# Patient Record
Sex: Female | Born: 1970 | Race: White | Hispanic: No | Marital: Married | Smoking: Never smoker
Health system: Southern US, Community
[De-identification: ages and names within clinical notes are randomized; demographics above are authoritative.]

## PROBLEM LIST (undated history)

## (undated) DIAGNOSIS — K219 Gastro-esophageal reflux disease without esophagitis: Secondary | ICD-10-CM

## (undated) DIAGNOSIS — K279 Peptic ulcer, site unspecified, unspecified as acute or chronic, without hemorrhage or perforation: Secondary | ICD-10-CM

## (undated) HISTORY — DX: Gastro-esophageal reflux disease without esophagitis: K21.9

## (undated) HISTORY — DX: Peptic ulcer, site unspecified, unspecified as acute or chronic, without hemorrhage or perforation: K27.9

## (undated) HISTORY — PX: ABDOMINAL HYSTERECTOMY: SHX81

## (undated) HISTORY — PX: APPENDECTOMY: SHX54

---

## 2017-05-09 ENCOUNTER — Ambulatory Visit: Payer: Self-pay | Admitting: Family Medicine

## 2019-05-29 ENCOUNTER — Other Ambulatory Visit: Payer: Self-pay | Admitting: Obstetrics and Gynecology

## 2019-05-29 DIAGNOSIS — R928 Other abnormal and inconclusive findings on diagnostic imaging of breast: Secondary | ICD-10-CM

## 2019-05-31 ENCOUNTER — Ambulatory Visit
Admission: RE | Admit: 2019-05-31 | Discharge: 2019-05-31 | Disposition: A | Discharge status: Home / Self Care | Payer: BC Managed Care – PPO | Source: Ambulatory Visit | Attending: Obstetrics and Gynecology | Admitting: Obstetrics and Gynecology

## 2019-05-31 ENCOUNTER — Other Ambulatory Visit: Payer: Self-pay

## 2019-05-31 DIAGNOSIS — R928 Other abnormal and inconclusive findings on diagnostic imaging of breast: Secondary | ICD-10-CM

## 2020-07-08 ENCOUNTER — Other Ambulatory Visit: Payer: Self-pay | Admitting: Obstetrics and Gynecology

## 2020-07-08 DIAGNOSIS — R928 Other abnormal and inconclusive findings on diagnostic imaging of breast: Secondary | ICD-10-CM

## 2020-07-29 ENCOUNTER — Ambulatory Visit
Admission: RE | Admit: 2020-07-29 | Discharge: 2020-07-29 | Disposition: A | Discharge status: Home / Self Care | Payer: BC Managed Care – PPO | Source: Ambulatory Visit | Attending: Obstetrics and Gynecology | Admitting: Obstetrics and Gynecology

## 2020-07-29 ENCOUNTER — Other Ambulatory Visit: Payer: Self-pay

## 2020-07-29 DIAGNOSIS — R928 Other abnormal and inconclusive findings on diagnostic imaging of breast: Secondary | ICD-10-CM

## 2021-04-29 ENCOUNTER — Telehealth: Payer: Self-pay

## 2021-04-29 NOTE — Telephone Encounter (Signed)
NOTES SCANNED TO REFERRAL 

## 2021-05-27 ENCOUNTER — Other Ambulatory Visit: Payer: Self-pay

## 2021-05-27 ENCOUNTER — Ambulatory Visit (INDEPENDENT_AMBULATORY_CARE_PROVIDER_SITE_OTHER): Payer: 59

## 2021-05-27 ENCOUNTER — Encounter: Payer: Self-pay | Admitting: Cardiology

## 2021-05-27 ENCOUNTER — Ambulatory Visit: Payer: 59 | Admitting: Cardiology

## 2021-05-27 VITALS — BP 92/60 | HR 60 | Ht 61.0 in | Wt 126.0 lb

## 2021-05-27 DIAGNOSIS — E785 Hyperlipidemia, unspecified: Secondary | ICD-10-CM | POA: Diagnosis not present

## 2021-05-27 DIAGNOSIS — R002 Palpitations: Secondary | ICD-10-CM | POA: Diagnosis not present

## 2021-05-27 DIAGNOSIS — Z8249 Family history of ischemic heart disease and other diseases of the circulatory system: Secondary | ICD-10-CM | POA: Diagnosis not present

## 2021-05-27 DIAGNOSIS — E7800 Pure hypercholesterolemia, unspecified: Secondary | ICD-10-CM | POA: Diagnosis not present

## 2021-05-27 NOTE — Progress Notes (Unsigned)
Enrolled for [employer] to mail a ZIO XT long term holter monitor to the patients address on file.  

## 2021-05-27 NOTE — Assessment & Plan Note (Signed)
Her father's father at age 49 had myocardial infarction cardiac arrest.  Her father at age 72 recently had a heart catheterization with calcified plaque.  She is currently asymptomatic from an anginal standpoint.  She may feel some subtle sharp pains for many years that she attributes to gas.  No exertional chest discomfort when she exercises 7 days a week, runs.  We will go ahead and check a coronary calcium score, $99.  If calcified plaque is present we will likely begin Crestor.  She reports excellent exercise and diet.

## 2021-05-27 NOTE — Assessment & Plan Note (Addendum)
Occasional palpitations felt at rest not with exercise.  No higher symptoms such as syncope.  Certainly could be PVCs or PACs.  Also a possibility is atrial tachycardia or possible SVT.  1 episode occurred that was abrupt onset and termination.  Lasted about 1 minute.  No other associated symptoms.  No murmurs heard on exam.  We will go ahead and check a Zio patch monitor for 14 days to ensure that there are no adverse arrhythmias.  TSH was normal from outside labs.  Electrolytes were also normal.

## 2021-05-27 NOTE — Patient Instructions (Signed)
Medication Instructions:  The current medical regimen is effective;  continue present plan and medications.  *If you need a refill on your cardiac medications before your next appointment, please call your pharmacy*  Testing/Procedures: Your physician has requested that you have a Coronary Calcium score which is completed by CT. Cardiac computed tomography (CT) is a painless test that uses an x-ray machine to take clear, detailed pictures of your heart. There are no instructions for this testing.  You may eat/drink and take your normal medications this day.  The cost of the testing is $99 due at the time of your appointment.  ZIO XT- Long Term Monitor Instructions  Your physician has requested you wear a ZIO patch monitor for 14 days.  This is a single patch monitor. [employer] supplies one patch monitor per enrollment. Additional stickers are not available. Please do not apply patch if you will be having a Nuclear Stress Test,  Echocardiogram, Cardiac CT, MRI, or Chest Xray during the period you would be wearing the  monitor. The patch cannot be worn during these tests. You cannot remove and re-apply the  ZIO XT patch monitor.  Your ZIO patch monitor will be mailed 3 day USPS to your address on file. It may take 3-5 days  to receive your monitor after you have been enrolled.  Once you have received your monitor, please review the enclosed instructions. Your monitor  has already been registered assigning a specific monitor serial # to you.  Billing and Patient Assistance Program Information  We have supplied [employer] with any of your insurance information on file for billing purposes. [employer] offers a sliding scale Patient Assistance Program for patients that do not have  insurance, or whose insurance does not completely cover the cost of the ZIO monitor.  You must apply for the Patient Assistance Program to qualify for this discounted rate.  To apply, please call [employer] at [phone],  select option 4, select option 2, ask to apply for  Patient Assistance Program. Theodore Demark will ask your household income, and how many people  are in your household. They will quote your out-of-pocket cost based on that information.  [employer] will also be able to set up a 15-month, interest-free payment plan if needed.  Applying the monitor   Shave hair from upper left chest.  Hold abrader disc by orange tab. Rub abrader in 40 strokes over the upper left chest as  indicated in your monitor instructions.  Clean area with 4 enclosed alcohol pads. Let dry.  Apply patch as indicated in monitor instructions. Patch will be placed under collarbone on left  side of chest with arrow pointing upward.  Rub patch adhesive wings for 2 minutes. Remove white label marked "1". Remove the white  label marked "2". Rub patch adhesive wings for 2 additional minutes.  While looking in a mirror, press and release button in center of patch. A small green light will  flash 3-4 times. This will be your only indicator that the monitor has been turned on.  Do not shower for the first 24 hours. You may shower after the first 24 hours.  Press the button if you feel a symptom. You will hear a small click. Record Date, Time and  Symptom in the Patient Logbook.  When you are ready to remove the patch, follow instructions on the last 2 pages of Patient  Logbook. Stick patch monitor onto the last page of Patient Logbook.  Place Patient Logbook in the blue and white  box. Use locking tab on box and tape box closed  securely. The blue and white box has prepaid postage on it. Please place it in the mailbox as  soon as possible. Your physician should have your test results approximately 7 days after the  monitor has been mailed back to [facility].  Call [facility] Customer Care at [phone] if you have questions regarding  your ZIO XT patch monitor. Call them immediately if you see an orange light blinking on your   monitor.  If your monitor falls off in less than 4 days, contact our Monitor department at [phone].  If your monitor becomes loose or falls off after 4 days call [employer] at [phone] for  suggestions on securing your monitor   Follow-Up: At [facility], you and your health needs are our priority.  As part of our continuing mission to provide you with exceptional heart care, we have created designated Provider Care Teams.  These Care Teams include your primary Cardiologist (physician) and Advanced Practice Providers (APPs -  Physician Assistants and Nurse Practitioners) who all work together to provide you with the care you need, when you need it.  We recommend signing up for the patient portal called "MyChart".  Sign up information is provided on this After Visit Summary.  MyChart is used to connect with patients for Virtual Visits (Telemedicine).  Patients are able to view lab/test results, encounter notes, upcoming appointments, etc.  Non-urgent messages can be sent to your provider as well.   To learn more about what you can do with MyChart, go to [email or web address].    Follow up will be based on the results of the above testing.  Thank you for choosing  [facility]!!

## 2021-05-27 NOTE — Assessment & Plan Note (Signed)
LDL 139 HDL 51 triglycerides 104 creatinine 0.89.

## 2021-05-27 NOTE — Progress Notes (Signed)
Cardiology Office Note:    Date:  05/27/2021   ID:  Samantha Solomon, DOB Jul 04, 1970, MRN [id number]  PCP:  Vivien Presto, MD  Cardiologist:  Donato Schultz, MD    Referring MD: Vivien Presto, MD   No chief complaint on file.   History of Present Illness:    Samantha Solomon is a 51 y.o. female with a hx of GERD and PUD here today for the evaluation and management of palpitations at the request of Dr. Salvadore Farber.  She last saw Dr. Salvadore Farber 04/21/21 for her annual physical. She complained of intermittent palpitations and was referred to cardiology.  Today, she reports the palpitations could occur intermittently and randomly. One episode occurred during dinner when she felt her heart rate suddenly increase. She had to take deep breaths and walk around until the palpitations stopped. This episode lasted for a few minutes. She also feels the palpitations at night and describes them as a pounding sensation.   She endorses chest pain but associates the pain to gas. When she takes a deep breath and lets it out slowly, the pain dissipates. Taking the initial deep breath causes her discomfort. She does not feel the palpitations or chest pain when exercising.   She endorses mild bilateral LE and hand edema. She notices the swelling after runs and at the end of the day. The swelling improves by the next morning.   She stays active by running and biking daily. For diet, she avoids dairy and carbohydrates. Her paternal grandfather passed away in his 71s due to cardiac arrest. Her father is 51 years old and recently underwent a heart catheterization. She denies any shortness of breath, lightheadedness, headaches, syncope, orthopnea, PND, lower extremity edema or exertional symptoms.  Past Medical History:  Diagnosis Date   GERD (gastroesophageal reflux disease)    PUD (peptic ulcer disease)     Past Surgical History:  Procedure Laterality Date   ABDOMINAL HYSTERECTOMY     APPENDECTOMY      CESAREAN SECTION      Current Medications: Current Meds  Medication Sig   CALCIUM CARB-CHOLECALCIFEROL PO Take by mouth.   finasteride (PROSCAR) 5 MG tablet Take 5 mg by mouth daily.   LINZESS 290 MCG CAPS capsule Take 290 mcg by mouth daily.   pantoprazole (PROTONIX) 40 MG tablet Take 1 tablet by mouth daily.   Rhubarb (ESTROVEN COMPLETE PO) Take by mouth.   vitamin B-12 (CYANOCOBALAMIN) 500 MCG tablet Take 500 mcg by mouth daily.     Allergies:   Patient has no known allergies.   Social History   Socioeconomic History   Marital status: Married    Spouse name: Not on file   Number of children: Not on file   Years of education: Not on file   Highest education level: Not on file  Occupational History   Not on file  Tobacco Use   Smoking status: Never   Smokeless tobacco: Never  Substance and Sexual Activity   Alcohol use: Not Currently   Drug use: Never   Sexual activity: Not on file  Other Topics Concern   Not on file  Social History Narrative   Not on file   Social Determinants of Health   Financial Resource Strain: Not on file  Food Insecurity: Not on file  Transportation Needs: Not on file  Physical Activity: Not on file  Stress: Not on file  Social Connections: Not on file     Family History: The patient's family history includes  Heart attack in her paternal grandfather.  ROS:   Please see the history of present illness.  (+) Palpitations (+) Chest pain   (+) Bilateral LE and hand edema All other systems reviewed and negative.   EKGs/Labs/Other Studies Reviewed:    The following studies were reviewed today: No prior cardiovascular studies  EKG:  EKG was personally reviewed 05/27/21: Sinus rhythm, rate 60 bpm  Recent Labs: No results found for requested labs within last 8760 hours.   Recent Lipid Panel No results found for: CHOL, TRIG, HDL, CHOLHDL, VLDL, LDLCALC, LDLDIRECT  CHA2DS2-VASc Score =   [ ] .  Therefore, the patient's annual risk of  stroke is   %.        Physical Exam:    VS:  BP 92/60 (BP Location: Left Arm, Patient Position: Sitting, Cuff Size: Normal)    Pulse 60    Ht 5\' 1"  (1.549 m)    Wt 126 lb (57.2 kg)    SpO2 98%    BMI 23.81 kg/m     Wt Readings from Last 3 Encounters:  05/27/21 126 lb (57.2 kg)     GEN: Well nourished, well developed in no acute distress HEENT: Normal NECK: No JVD; No carotid bruits LYMPHATICS: No lymphadenopathy CARDIAC: RRR, no murmurs, rubs, gallops RESPIRATORY:  Clear to auscultation without rales, wheezing or rhonchi  ABDOMEN: Soft, non-tender, non-distended MUSCULOSKELETAL:  No edema; No deformity  SKIN: Warm and dry NEUROLOGIC:  Alert and oriented x 3 PSYCHIATRIC:  Normal affect   ASSESSMENT:    1. Hyperlipidemia, unspecified hyperlipidemia type   2. Palpitations   3. Family history of coronary artery disease   4. Family history of early CAD   5. Pure hypercholesterolemia    PLAN:    Palpitations Occasional palpitations felt at rest not with exercise.  No higher symptoms such as syncope.  Certainly could be PVCs or PACs.  Also a possibility is atrial tachycardia or possible SVT.  1 episode occurred that was abrupt onset and termination.  Lasted about 1 minute.  No other associated symptoms.  No murmurs heard on exam.  We will go ahead and check a Zio patch monitor for 14 days to ensure that there are no adverse arrhythmias.  TSH was normal from outside labs.  Electrolytes were also normal.  Family history of early CAD Her father's father at age 57 had myocardial infarction cardiac arrest.  Her father at age 64 recently had a heart catheterization with calcified plaque.  She is currently asymptomatic from an anginal standpoint.  She may feel some subtle sharp pains for many years that she attributes to gas.  No exertional chest discomfort when she exercises 7 days a week, runs.  We will go ahead and check a coronary calcium score, $99.  If calcified plaque is present we  will likely begin Crestor.  She reports excellent exercise and diet.  Pure hypercholesterolemia LDL 139 HDL 51 triglycerides 104 creatinine 0.89.    Marland Kitchen   Medication Adjustments/Labs and Tests Ordered: Current medicines are reviewed at length with the patient today.  Concerns regarding medicines are outlined above.  Orders Placed This Encounter  Procedures   CT CARDIAC SCORING (SELF PAY ONLY)   LONG TERM MONITOR (3-14 DAYS)   EKG 12-Lead   No orders of the defined types were placed in this encounter.  I,Mykaella Javier,acting as a scribe for [employer], MD.,have documented all relevant documentation on the behalf of Donato Schultz, MD,as directed by  Donato Schultz,  MD while in the presence of Donato Schultz, MD.  I, Donato Schultz, MD, have reviewed all documentation for this visit. The documentation on 05/27/21 for the exam, diagnosis, procedures, and orders are all accurate and complete.   Signed, Donato Schultz, MD  05/27/2021 8:50 AM    [facility]

## 2021-05-30 DIAGNOSIS — R002 Palpitations: Secondary | ICD-10-CM

## 2021-06-29 ENCOUNTER — Telehealth: Payer: Self-pay | Admitting: Cardiology

## 2021-06-29 NOTE — Telephone Encounter (Signed)
Patient returning a call to discuss Monitor results  ?

## 2021-06-29 NOTE — Telephone Encounter (Signed)
Spoke with pt and reviewed results and recommendations.  Pt appreciative for call.  ?

## 2021-07-20 ENCOUNTER — Encounter: Payer: Self-pay | Admitting: Radiology

## 2021-07-20 ENCOUNTER — Ambulatory Visit
Admission: RE | Admit: 2021-07-20 | Discharge: 2021-07-20 | Disposition: A | Discharge status: Home / Self Care | Payer: Self-pay | Source: Ambulatory Visit | Attending: Cardiology | Admitting: Cardiology

## 2021-07-20 DIAGNOSIS — E785 Hyperlipidemia, unspecified: Secondary | ICD-10-CM

## 2021-07-20 DIAGNOSIS — Z8249 Family history of ischemic heart disease and other diseases of the circulatory system: Secondary | ICD-10-CM

## 2021-07-20 DIAGNOSIS — R002 Palpitations: Secondary | ICD-10-CM

## 2021-07-22 NOTE — Progress Notes (Signed)
?Terrilee Files D.O. ?[facility] ?[address] [place] [address] ?Phone: [phone] ?Subjective:   ?I, Wilford Grist, am serving as a scribe for Dr. Antoine Primas. ? ?This visit occurred during the SARS-CoV-2 public health emergency.  Safety protocols were in place, including screening questions prior to the visit, additional usage of staff PPE, and extensive cleaning of exam room while observing appropriate contact time as indicated for disinfecting solutions.  ?I'm seeing this patient by the request  of:  [name], [name], MD ? ?CC: Low back pain ? ?[id number]  ?Samantha Solomon is a 51 y.o. female coming in with complaint of back pain. Patient states that she has had back pain since her 43s. Pain in lumbar spine directly over the spine. No injury to this area. Ice, heat, Advil. Denies any radiating symptoms.  Patient has had difficulty with this previously.  States that he has had the pain intermittently for quite some time. ? ? ?  ? ?Past Medical History:  ?Diagnosis Date  ? GERD (gastroesophageal reflux disease)   ? PUD (peptic ulcer disease)   ? ?Past Surgical History:  ?Procedure Laterality Date  ? ABDOMINAL HYSTERECTOMY    ? APPENDECTOMY    ? CESAREAN SECTION    ? ?Social History  ? ?Socioeconomic History  ? Marital status: Married  ?  Spouse name: Not on file  ? Number of children: Not on file  ? Years of education: Not on file  ? Highest education level: Not on file  ?Occupational History  ? Not on file  ?Tobacco Use  ? Smoking status: Never  ? Smokeless tobacco: Never  ?Substance and Sexual Activity  ? Alcohol use: Not Currently  ? Drug use: Never  ? Sexual activity: Not on file  ?Other Topics Concern  ? Not on file  ?Social History Narrative  ? Not on file  ? ?Social Determinants of Health  ? ?Financial Resource Strain: Not on file  ?Food Insecurity: Not on file  ?Transportation Needs: Not on file  ?Physical Activity: Not on file  ?Stress: Not on file  ?Social Connections: Not on  file  ? ?No Known Allergies ?Family History  ?Problem Relation Age of Onset  ? Heart attack Paternal Grandfather   ?     In his 47s  ? ? ? ? ? ? ?Current Outpatient Medications (Hematological):  ?  vitamin B-12 (CYANOCOBALAMIN) 500 MCG tablet, Take 500 mcg by mouth daily. ? ?Current Outpatient Medications (Other):  ?  CALCIUM CARB-CHOLECALCIFEROL PO, Take by mouth. ?  finasteride (PROSCAR) 5 MG tablet, Take 5 mg by mouth daily. ?  LINZESS 290 MCG CAPS capsule, Take 290 mcg by mouth daily. ?  pantoprazole (PROTONIX) 40 MG tablet, Take 1 tablet by mouth daily. ?  Rhubarb (ESTROVEN COMPLETE PO), Take by mouth. ? ? ?Reviewed prior external information including notes and imaging from  ?primary care provider ?As well as notes that were available from care everywhere and other healthcare systems. ? ?Past medical history, social, surgical and family history all reviewed in electronic medical record.  No pertanent information unless stated regarding to the chief complaint.  ? ?Review of Systems: ? No headache, visual changes, nausea, vomiting, diarrhea, constipation, dizziness, abdominal pain, skin rash, fevers, chills, night sweats, weight loss, swollen lymph nodes, body aches, joint swelling, chest pain, shortness of breath, mood changes. POSITIVE muscle aches ? ?Objective  ?Blood pressure 96/66, pulse 78, height 5\' 1"  (1.549 m), weight 123 lb (55.8 kg), SpO2 98 %. ?  ?  General: No apparent distress alert and oriented x3 mood and affect normal, dressed appropriately.  ?HEENT: Pupils equal, extraocular movements intact  ?Respiratory: Patient's speak in full sentences and does not appear short of breath  ?Cardiovascular: No lower extremity edema, non tender, no erythema  ?Gait normal with good balance and coordination.  ?MSK: Low back exam does have some mild loss of lordosis.  Patient does have some mild scoliosis noted as well.  Mild tenderness to palpation in the paraspinal musculature. ? ?Osteopathic findings ?T3  extended rotated and side bent right inhaled third rib ?T9 extended rotated and side bent left ?L2 through L5 neutral, rotated right side bent left ?Sacrum right on right ? ?97110; 15 additional minutes spent for Therapeutic exercises as stated in above notes.  This included exercises focusing on stretching, strengthening, with significant focus on eccentric aspects.   Long term goals include an improvement in range of motion, strength, endurance as well as avoiding reinjury. Patient's frequency would include in 1-2 times a day, 3-5 times a week for a duration of 6-12 weeks. Low back exercises that included:  ?Pelvic tilt/bracing instruction to focus on control of the pelvic girdle and lower abdominal muscles  ?Glute strengthening exercises, focusing on proper firing of the glutes without engaging the low back muscles ?Proper stretching techniques for maximum relief for the hamstrings, hip flexors, low back and some rotation where tolerated ? Proper technique shown and discussed handout in great detail with ATC.  All questions were discussed and answered.  ? ?  ?Impression and Recommendations:  ?  ? ?The above documentation has been reviewed and is accurate and complete Judi Saa, DO ? ? ? ?

## 2021-07-23 ENCOUNTER — Telehealth: Payer: Self-pay | Admitting: Cardiology

## 2021-07-23 ENCOUNTER — Ambulatory Visit: Payer: 59 | Admitting: Family Medicine

## 2021-07-23 VITALS — BP 96/66 | HR 78 | Ht 61.0 in | Wt 123.0 lb

## 2021-07-23 DIAGNOSIS — M9902 Segmental and somatic dysfunction of thoracic region: Secondary | ICD-10-CM | POA: Diagnosis not present

## 2021-07-23 DIAGNOSIS — M9904 Segmental and somatic dysfunction of sacral region: Secondary | ICD-10-CM

## 2021-07-23 DIAGNOSIS — M9903 Segmental and somatic dysfunction of lumbar region: Secondary | ICD-10-CM | POA: Diagnosis not present

## 2021-07-23 DIAGNOSIS — M545 Low back pain, unspecified: Secondary | ICD-10-CM | POA: Diagnosis not present

## 2021-07-23 NOTE — Telephone Encounter (Signed)
Patient calling for CT results. 

## 2021-07-23 NOTE — Telephone Encounter (Signed)
Spoke with pt and reviewed results of Calcium Score of 0.  Pt was pleased with results.  All questions, if any were answered at the time of the call.   ?

## 2021-07-23 NOTE — Assessment & Plan Note (Signed)
Patient does have some mild underlying scoliosis that attempted some difficulty.  Discussed with patient about icing regimen and home exercises.  Discussed some core strengthening exercises.  Nothing severe that should stop her from significant activity.  Responded extremely well to osteopathic manipulation.  Hold on any type of medications at the moment.  Follow-up again 6 to 12 weeks ?

## 2021-07-23 NOTE — Assessment & Plan Note (Signed)

## 2021-07-23 NOTE — Patient Instructions (Signed)
Exercises ?IBU 300mg  3x a day with flares ?Ice is better than heat ?See me in 6 weeks ?
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
## 2021-09-03 ENCOUNTER — Ambulatory Visit (INDEPENDENT_AMBULATORY_CARE_PROVIDER_SITE_OTHER): Payer: 59

## 2021-09-03 ENCOUNTER — Ambulatory Visit: Payer: 59 | Admitting: Family Medicine

## 2021-09-03 VITALS — BP 112/70 | HR 81 | Ht 61.0 in | Wt 125.0 lb

## 2021-09-03 DIAGNOSIS — M9903 Segmental and somatic dysfunction of lumbar region: Secondary | ICD-10-CM | POA: Diagnosis not present

## 2021-09-03 DIAGNOSIS — M9901 Segmental and somatic dysfunction of cervical region: Secondary | ICD-10-CM | POA: Diagnosis not present

## 2021-09-03 DIAGNOSIS — M9904 Segmental and somatic dysfunction of sacral region: Secondary | ICD-10-CM | POA: Diagnosis not present

## 2021-09-03 DIAGNOSIS — M79671 Pain in right foot: Secondary | ICD-10-CM | POA: Diagnosis not present

## 2021-09-03 DIAGNOSIS — M79674 Pain in right toe(s): Secondary | ICD-10-CM

## 2021-09-03 DIAGNOSIS — M9902 Segmental and somatic dysfunction of thoracic region: Secondary | ICD-10-CM

## 2021-09-03 DIAGNOSIS — M545 Low back pain, unspecified: Secondary | ICD-10-CM

## 2021-09-03 DIAGNOSIS — M9908 Segmental and somatic dysfunction of rib cage: Secondary | ICD-10-CM | POA: Diagnosis not present

## 2021-09-03 NOTE — Assessment & Plan Note (Signed)
Has contusion and likely fifth metatarsal fracture.  Nondisplaced.  Discussed regimen made foot shoes.  Discussed avoiding being barefoot, Arnica lotion follow-up again in 4 weeks and return if any worsening pain.

## 2021-09-03 NOTE — Assessment & Plan Note (Signed)
Chronic problem with exacerbation likely secondary to palpation of been walking since a foot injury.  Responded well to osteopathic manipulation.  Discussed with patient anti-inflammatories for the exacerbation Follow-up again 6 to 8 weeks

## 2021-09-03 NOTE — Patient Instructions (Signed)
Xray today Arnica lotion for you and daughter Tell daughter to take it easy until Monday See you again in 6-8 weeks

## 2021-10-21 NOTE — Progress Notes (Signed)
[name] [address] [place] [address] Phone: [phone] Subjective:   Samantha Solomon, am serving as a scribe for Dr. Antoine Primas.   I'm seeing this patient by the request  of:  [name], [name], MD  CC: Back pain, foot pain follow-up  [id number]  Samantha Solomon is a 51 y.o. female coming in with complaint of back and neck pain. OMT 09/03/2021. Also f/u for R foot pain. Patient states that the foot is much better. Her back is also improving. She states that she is more mindful of positions that might increase pain in her back.   Medications patient has been prescribed: None  Taking:         Reviewed prior external information including notes and imaging from previsou exam, outside providers and external EMR if available.   As well as notes that were available from care everywhere and other healthcare systems.  Past medical history, social, surgical and family history all reviewed in electronic medical record.  No pertanent information unless stated regarding to the chief complaint.   Past Medical History:  Diagnosis Date   GERD (gastroesophageal reflux disease)    PUD (peptic ulcer disease)     No Known Allergies   Review of Systems:  No headache, visual changes, nausea, vomiting, diarrhea, constipation, dizziness, abdominal pain, skin rash, fevers, chills, night sweats, weight loss, swollen lymph nodes, body aches, joint swelling, chest pain, shortness of breath, mood changes. POSITIVE muscle aches  Objective  Blood pressure 102/76, pulse 67, height 5\' 1"  (1.549 m), weight 122 lb (55.3 kg), SpO2 99 %.   General: No apparent distress alert and oriented x3 mood and affect normal, dressed appropriately.  HEENT: Pupils equal, extraocular movements intact  Respiratory: Patient's speak in full sentences and does not appear short of breath  Cardiovascular: No lower extremity edema, non tender, no erythema  Gait  normal MSK: Foot exam does not have any significant pain today. Back does have some loss of lordosis.  Some tightness noted in the paraspinal musculature seems to be right greater than left.  Positive FABER test noted on the right side.  Osteopathic findings  C2 flexed rotated and side bent right C7 flexed rotated and side bent left T3 extended rotated and side bent right inhaled rib T8 extended rotated and side bent left L2 flexed rotated and side bent right Sacrum right on right       Assessment and Plan:  Right foot pain Relatively nontender on exam.  No other changes follow-up as needed  Low back pain Chronic, with mild exacerbation again.  Encourage patient to continue to work on core strengthening.  Discussed posture and ergonomics otherwise.  Increase activity slowly.  Follow-up again in 6 to 8 weeks.  Patient has responded well to the manipulation.    Nonallopathic problems  Decision today to treat with OMT was based on Physical Exam  After verbal consent patient was treated with HVLA, ME, FPR techniques in cervical, rib, thoracic, lumbar, and sacral  areas  Patient tolerated the procedure well with improvement in symptoms  Patient given exercises, stretches and lifestyle modifications  See medications in patient instructions if given  Patient will follow up in 4-8 weeks     The above documentation has been reviewed and is accurate and complete , DO         Note: This dictation was prepared with Dragon dictation along with smaller phrase technology. Any transcriptional errors that  result from this process are unintentional.

## 2021-10-26 ENCOUNTER — Encounter: Payer: Self-pay | Admitting: Family Medicine

## 2021-10-26 ENCOUNTER — Ambulatory Visit: Payer: 59 | Admitting: Family Medicine

## 2021-10-26 ENCOUNTER — Ambulatory Visit: Payer: Self-pay

## 2021-10-26 VITALS — BP 102/76 | HR 67 | Ht 61.0 in | Wt 122.0 lb

## 2021-10-26 DIAGNOSIS — M9902 Segmental and somatic dysfunction of thoracic region: Secondary | ICD-10-CM | POA: Diagnosis not present

## 2021-10-26 DIAGNOSIS — M9908 Segmental and somatic dysfunction of rib cage: Secondary | ICD-10-CM | POA: Diagnosis not present

## 2021-10-26 DIAGNOSIS — M9903 Segmental and somatic dysfunction of lumbar region: Secondary | ICD-10-CM | POA: Diagnosis not present

## 2021-10-26 DIAGNOSIS — M9901 Segmental and somatic dysfunction of cervical region: Secondary | ICD-10-CM

## 2021-10-26 DIAGNOSIS — M9904 Segmental and somatic dysfunction of sacral region: Secondary | ICD-10-CM | POA: Diagnosis not present

## 2021-10-26 DIAGNOSIS — M79671 Pain in right foot: Secondary | ICD-10-CM

## 2021-10-26 DIAGNOSIS — M545 Low back pain, unspecified: Secondary | ICD-10-CM | POA: Diagnosis not present

## 2021-10-26 NOTE — Patient Instructions (Signed)
See me in 2-3 months

## 2021-10-26 NOTE — Assessment & Plan Note (Signed)
Chronic, with mild exacerbation again.  Encourage patient to continue to work on core strengthening.  Discussed posture and ergonomics otherwise.  Increase activity slowly.  Follow-up again in 6 to 8 weeks.  Patient has responded well to the manipulation.

## 2021-10-26 NOTE — Assessment & Plan Note (Signed)
Relatively nontender on exam.  No other changes follow-up as needed

## 2021-12-20 ENCOUNTER — Telehealth: Payer: Self-pay | Admitting: Oncology

## 2021-12-21 ENCOUNTER — Other Ambulatory Visit: Payer: Self-pay | Admitting: *Deleted

## 2021-12-21 DIAGNOSIS — I82492 Acute embolism and thrombosis of other specified deep vein of left lower extremity: Secondary | ICD-10-CM

## 2021-12-22 ENCOUNTER — Telehealth: Payer: Self-pay | Admitting: Hematology and Oncology

## 2021-12-22 NOTE — Telephone Encounter (Signed)
Scheduled appt per 9/19 referral. Pt is aware of appt date and time. Pt is aware to arrive 15 mins prior to appt time and to bring and updated insurance card. Pt is aware of appt location.   

## 2022-01-05 NOTE — Progress Notes (Signed)
[facility] Phone: [phone] Subjective:   [name], am serving as a scribe for Dr. Hulan Saas.  I'm seeing this patient by the request  of:  [name], [name], MD  CC: Low back pain  [id number]  Tabbatha Bordelon is a 51 y.o. female coming in with complaint of back and neck pain. OMT 10/26/2021. Was bitten by copperhead since last visit. Patient states that she has been sedentary and had to use crutches for 4 weeks. Feels as if lumbar spine and hips are out of alignment.   Medications patient has been prescribed: None  Taking:         Reviewed prior external information including notes and imaging from previsou exam, outside providers and external EMR if available.   As well as notes that were available from care everywhere and other healthcare systems.  Past medical history, social, surgical and family history all reviewed in electronic medical record.  No pertanent information unless stated regarding to the chief complaint.   Past Medical History:  Diagnosis Date   GERD (gastroesophageal reflux disease)    PUD (peptic ulcer disease)     No Known Allergies   Review of Systems:  No headache, visual changes, nausea, vomiting, diarrhea, constipation, dizziness, abdominal pain, skin rash, fevers, chills, night sweats, weight loss, swollen lymph nodes, body aches, joint swelling, chest pain, shortness of breath, mood changes. POSITIVE muscle aches  Objective  Blood pressure 102/72, pulse 82, height 5\' 1"  (1.549 m), weight 124 lb (56.2 kg), SpO2 98 %.   General: No apparent distress alert and oriented x3 mood and affect normal, dressed appropriately.  HEENT: Pupils equal, extraocular movements intact  Respiratory: Patient's speak in full sentences and does not appear short of breath  Cardiovascular: No lower extremity edema, non tender, no erythema  Gait somewhat guarded protecting  patient's left lower extremity secondary to some mild swelling in the calf. MSK:  Back low back does have some loss of lordosis.  Some severe tenderness over the sacroiliac joint.  Positive Faber on the right side.  Osteopathic findings  C6 flexed rotated and side bent left T5 extended rotated and side bent right inhaled rib L2 flexed rotated and side bent right Sacrum right on right       Assessment and Plan:  Low back pain Patient does have low back pain noted.  Patient does have tenderness to palpation over the sacroiliac joint.  Patient does have a positive pain on the right side with positive FABER test.  History of DVT (deep vein thrombosis) History of DVT.  Patient was bitten by a snake everything at that visit.  Patient does still have some soft tissue swelling we discussed compression being potentially beneficial.  Discussed with patient a mild heel lift.  Patient will be following up with hematology in the near future to discuss the blood thinner.    Nonallopathic problems  Decision today to treat with OMT was based on Physical Exam  After verbal consent patient was treated with HVLA, ME, FPR techniques in cervical, rib, thoracic, lumbar, and sacral  areas  Patient tolerated the procedure well with improvement in symptoms  Patient given exercises, stretches and lifestyle modifications  See medications in patient instructions if given  Patient will follow up in 4-8 weeks      The above documentation has been reviewed and is accurate and complete Lyndal Pulley, DO  Note: This dictation was prepared with Dragon dictation along with smaller phrase technology. Any transcriptional errors that result from this process are unintentional.

## 2022-01-07 ENCOUNTER — Ambulatory Visit (INDEPENDENT_AMBULATORY_CARE_PROVIDER_SITE_OTHER): Payer: 59 | Admitting: Family Medicine

## 2022-01-07 ENCOUNTER — Encounter: Payer: Self-pay | Admitting: Family Medicine

## 2022-01-07 VITALS — BP 102/72 | HR 82 | Ht 61.0 in | Wt 124.0 lb

## 2022-01-07 DIAGNOSIS — Z86718 Personal history of other venous thrombosis and embolism: Secondary | ICD-10-CM | POA: Insufficient documentation

## 2022-01-07 DIAGNOSIS — M9901 Segmental and somatic dysfunction of cervical region: Secondary | ICD-10-CM | POA: Diagnosis not present

## 2022-01-07 DIAGNOSIS — M9902 Segmental and somatic dysfunction of thoracic region: Secondary | ICD-10-CM

## 2022-01-07 DIAGNOSIS — M545 Low back pain, unspecified: Secondary | ICD-10-CM | POA: Diagnosis not present

## 2022-01-07 DIAGNOSIS — M9904 Segmental and somatic dysfunction of sacral region: Secondary | ICD-10-CM

## 2022-01-07 DIAGNOSIS — M9908 Segmental and somatic dysfunction of rib cage: Secondary | ICD-10-CM

## 2022-01-07 DIAGNOSIS — M9903 Segmental and somatic dysfunction of lumbar region: Secondary | ICD-10-CM

## 2022-01-07 MED ORDER — GABAPENTIN 100 MG PO CAPS: 200.0000 mg | ORAL_CAPSULE | Freq: Every day | ORAL | 0 refills | Status: DC

## 2022-01-07 NOTE — Patient Instructions (Signed)
Good to see you Gabapentin 200mg  at night Wear compression socks 15mg  of mercury for socks 1/8 inch heel lift Ok to start increasing activity See me in 4-6 weeks

## 2022-01-07 NOTE — Assessment & Plan Note (Signed)
Patient does have low back pain noted.  Patient does have tenderness to palpation over the sacroiliac joint.  Patient does have a positive pain on the right side with positive FABER test.

## 2022-01-07 NOTE — Assessment & Plan Note (Addendum)
History of DVT.  Patient was bitten by a snake everything at that visit.  Patient does still have some soft tissue swelling we discussed compression being potentially beneficial.  Discussed with patient a mild heel lift.  Patient will be following up with hematology in the near future to discuss the blood thinner.  Gabapentin given for the neurologic aspect of the pain she is having.

## 2022-01-14 ENCOUNTER — Inpatient Hospital Stay: Payer: 59 | Attending: Hematology and Oncology | Admitting: Hematology and Oncology

## 2022-01-14 ENCOUNTER — Other Ambulatory Visit: Payer: Self-pay | Admitting: *Deleted

## 2022-01-14 ENCOUNTER — Inpatient Hospital Stay: Payer: 59

## 2022-01-14 VITALS — BP 108/62 | HR 79 | Temp 97.9°F | Resp 14 | Wt 125.3 lb

## 2022-01-14 DIAGNOSIS — Z800 Family history of malignant neoplasm of digestive organs: Secondary | ICD-10-CM | POA: Diagnosis not present

## 2022-01-14 DIAGNOSIS — I82492 Acute embolism and thrombosis of other specified deep vein of left lower extremity: Secondary | ICD-10-CM

## 2022-01-14 DIAGNOSIS — Z7901 Long term (current) use of anticoagulants: Secondary | ICD-10-CM | POA: Diagnosis not present

## 2022-01-14 DIAGNOSIS — I82402 Acute embolism and thrombosis of unspecified deep veins of left lower extremity: Secondary | ICD-10-CM | POA: Diagnosis present

## 2022-01-14 LAB — CBC WITH DIFFERENTIAL (CANCER CENTER ONLY)
Abs Immature Granulocytes: 0.01 10*3/uL (ref 0.00–0.07)
Basophils Absolute: 0 10*3/uL (ref 0.0–0.1)
Basophils Relative: 0 %
Eosinophils Absolute: 0.1 10*3/uL (ref 0.0–0.5)
Eosinophils Relative: 2 %
HCT: 35.9 % — ABNORMAL LOW (ref 36.0–46.0)
Hemoglobin: 12 g/dL (ref 12.0–15.0)
Immature Granulocytes: 0 %
Lymphocytes Relative: 21 %
Lymphs Abs: 1.3 10*3/uL (ref 0.7–4.0)
MCH: 29.2 pg (ref 26.0–34.0)
MCHC: 33.4 g/dL (ref 30.0–36.0)
MCV: 87.3 fL (ref 80.0–100.0)
Monocytes Absolute: 0.5 10*3/uL (ref 0.1–1.0)
Monocytes Relative: 9 %
Neutro Abs: 4 10*3/uL (ref 1.7–7.7)
Neutrophils Relative %: 68 %
Platelet Count: 257 10*3/uL (ref 150–400)
RBC: 4.11 MIL/uL (ref 3.87–5.11)
RDW: 12.9 % (ref 11.5–15.5)
WBC Count: 5.9 10*3/uL (ref 4.0–10.5)
nRBC: 0 % (ref 0.0–0.2)

## 2022-01-14 LAB — CMP (CANCER CENTER ONLY)
ALT: 21 U/L (ref 0–44)
AST: 13 U/L — ABNORMAL LOW (ref 15–41)
Albumin: 4.4 g/dL (ref 3.5–5.0)
Alkaline Phosphatase: 60 U/L (ref 38–126)
Anion gap: 4 — ABNORMAL LOW (ref 5–15)
BUN: 11 mg/dL (ref 6–20)
CO2: 30 mmol/L (ref 22–32)
Calcium: 9.1 mg/dL (ref 8.9–10.3)
Chloride: 104 mmol/L (ref 98–111)
Creatinine: 0.7 mg/dL (ref 0.44–1.00)
GFR, Estimated: >60 mL/min (ref >60)
Glucose, Bld: 93 mg/dL (ref 70–99)
Potassium: 3.6 mmol/L (ref 3.5–5.1)
Sodium: 138 mmol/L (ref 135–145)
Total Bilirubin: 0.4 mg/dL (ref 0.3–1.2)
Total Protein: 7.1 g/dL (ref 6.5–8.1)

## 2022-01-14 MED ORDER — APIXABAN 5 MG PO TABS: 5.0000 mg | ORAL_TABLET | Freq: Two times a day (BID) | ORAL | 3 refills | Status: DC

## 2022-01-14 NOTE — Progress Notes (Signed)
[facility] [phone]   [phone]  INITIAL CONSULT NOTE  Samantha Solomon Care Team: [name], Meredith Mody, MD as PCP - General (Family Medicine) Jake Bathe, MD as PCP - Cardiology (Cardiology)  Hematological/Oncological History # Left Lower Extremity DVT Provoked by Snake Bite 12/01/2021: emergency department visit for snake bite and LLE pain. Received antivenom.  12/09/2021: returned to ED with worsening leg pain. DVT ultrasound obtained and did show occlusive thrombi throughout the lower leg, but no DVTs above the knee. Started on elqiuis.  01/14/2022: establish care with Dr. Leonides Schanz   CHIEF COMPLAINTS/PURPOSE OF CONSULTATION:  "Left Lower Extremity DVT  "  HISTORY OF PRESENTING ILLNESS:  Samantha Solomon 51 y.o. female with medical history significant for GERD, PUD, MTHFR mutation, and OCP use who presents for evaluation of DVT provoked by a snake bite.   On review of the previous records Samantha Solomon presents to the emergency department on 12/01/2021 after a snakebite.  She simply return to the emergency department on 12/09/2021 with worsening leg pain.  DVT ultrasound was performed and showed evidence of an occlusive thrombi throughout the lower leg but no DVT above the knee.  She was started on Eliquis therapy.  Due to concern for this blood clot provoked by snakebite she was referred to hematology for further evaluation and management.  On exam today Samantha Solomon counseled story of how she was bitten by a snake.  She reports that she was walking down the street at night and unfortunately stepped on a copperhead and was bit in the back of the leg.  She obtained a picture of the snake and the emergency department confirmed it with a copperhead.  She also reports that she is homozygous for the MTHFR mutation and has not had any previous blood clots in the past.  She reports that she went to the emergency department and was told to put an ice pack on it and was let  go.  The next morning she developed nausea and vomiting and went to her primary care provider with pain up to her knee.  She notes that the pain was "terrible".  She had redness, swelling, and marked discomfort.  She was given Percocet for the pain.  When the DVT was discovered she was started on Eliquis 5 mg twice daily and has been tolerating it well so far with no bleeding, bruising, or dark stools.  She reports that she is been having difficulty wearing socks due to tenderness but that the swelling is markedly improving and gabapentin is helping with the pain.  The Samantha Solomon was noted to have the MTHFR mutation after having 2 miscarriages.  She notes that she was on baby aspirin for the subsequent pregnancy and has never needed a "true blood thinner".  She does that she is able to afford the Eliquis quite well and is only $20 per month.  On further discussion she reports that her mother had a bile duct cancer and her paternal grandfather had a heart attack.  Her father is healthy.  She has a sister who also has the MTHFR mutation and she has 3 healthy children.  Samantha Solomon is a never smoker and drinks about 12 drinks of alcohol per year.  She currently works as a Research scientist (medical).  She is not on any estrogen-based therapies at this time.  She reports that she is improving at this time and is walking without difficulty.  She otherwise denies any fevers, chills, sweats, nausea, vomiting or diarrhea.  A full 10 point ROS was otherwise negative.  MEDICAL HISTORY:  Past Medical History:  Diagnosis Date   GERD (gastroesophageal reflux disease)    PUD (peptic ulcer disease)     SURGICAL HISTORY: Past Surgical History:  Procedure Laterality Date   ABDOMINAL HYSTERECTOMY     APPENDECTOMY     CESAREAN SECTION      SOCIAL HISTORY: Social History   Socioeconomic History   Marital status: Married    Spouse name: Not on file   Number of children: Not on file   Years of education: Not on file   Highest  education level: Not on file  Occupational History   Not on file  Tobacco Use   Smoking status: Never   Smokeless tobacco: Never  Substance and Sexual Activity   Alcohol use: Not Currently   Drug use: Never   Sexual activity: Not on file  Other Topics Concern   Not on file  Social History Narrative   Not on file   Social Determinants of Health   Financial Resource Strain: Not on file  Food Insecurity: Not on file  Transportation Needs: Not on file  Physical Activity: Not on file  Stress: Not on file  Social Connections: Not on file  Intimate Partner Violence: Not on file    FAMILY HISTORY: Family History  Problem Relation Age of Onset   Heart attack Paternal Grandfather        In his 73s    ALLERGIES:  has No Known Allergies.  MEDICATIONS:  Current Outpatient Medications  Medication Sig Dispense Refill   vitamin B-12 (CYANOCOBALAMIN) 500 MCG tablet Take 500 mcg by mouth daily.     apixaban (ELIQUIS) 5 MG TABS tablet Take 1 tablet (5 mg total) by mouth 2 (two) times daily. 60 tablet 3   CALCIUM CARB-CHOLECALCIFEROL PO Take by mouth.     finasteride (PROSCAR) 5 MG tablet Take 5 mg by mouth daily.     gabapentin (NEURONTIN) 100 MG capsule Take 2 capsules (200 mg total) by mouth at bedtime. 180 capsule 0   LINZESS 290 MCG CAPS capsule Take 290 mcg by mouth daily.     pantoprazole (PROTONIX) 40 MG tablet Take 1 tablet by mouth daily.     Rhubarb (ESTROVEN COMPLETE PO) Take by mouth. (Samantha Solomon not taking: Reported on 01/14/2022)     No current facility-administered medications for this visit.    REVIEW OF SYSTEMS:   Constitutional: ( - ) fevers, ( - )  chills , ( - ) night sweats Eyes: ( - ) blurriness of vision, ( - ) double vision, ( - ) watery eyes Ears, nose, mouth, throat, and face: ( - ) mucositis, ( - ) sore throat Respiratory: ( - ) cough, ( - ) dyspnea, ( - ) wheezes Cardiovascular: ( - ) palpitation, ( - ) chest discomfort, ( - ) lower extremity  swelling Gastrointestinal:  ( - ) nausea, ( - ) heartburn, ( - ) change in bowel habits Skin: ( - ) abnormal skin rashes Lymphatics: ( - ) new lymphadenopathy, ( - ) easy bruising Neurological: ( - ) numbness, ( - ) tingling, ( - ) new weaknesses Behavioral/Psych: ( - ) mood change, ( - ) new changes  All other systems were reviewed with the Samantha Solomon and are negative.  PHYSICAL EXAMINATION:  Vitals:   01/14/22 0915  BP: 108/62  Pulse: 79  Resp: 14  Temp: 97.9 F (36.6 C)  SpO2: 98%   Filed Weights  01/14/22 0915  Weight: 125 lb 4.8 oz (56.8 kg)    GENERAL: well appearing middle-aged Caucasian female in NAD  SKIN: skin color, texture, turgor are normal, no rashes or significant lesions EYES: conjunctiva are pink and non-injected, sclera clear LUNGS: clear to auscultation and percussion with normal breathing effort HEART: regular rate & rhythm and no murmurs and no lower extremity edema Musculoskeletal: no cyanosis of digits and no clubbing  PSYCH: alert & oriented x 3, fluent speech NEURO: no focal motor/sensory deficits  LABORATORY DATA:  I have reviewed the data as listed    Latest Ref Rng & Units 01/14/2022   10:21 AM  CBC  WBC 4.0 - 10.5 K/uL 5.9   Hemoglobin 12.0 - 15.0 g/dL 03.5   Hematocrit 46.5 - 46.0 % 35.9   Platelets 150 - 400 K/uL 257        Latest Ref Rng & Units 01/14/2022   10:21 AM  CMP  Glucose 70 - 99 mg/dL 93   BUN 6 - 20 mg/dL 11   Creatinine 6.81 - 1.00 mg/dL 2.75   Sodium 170 - 017 mmol/L 138   Potassium 3.5 - 5.1 mmol/L 3.6   Chloride 98 - 111 mmol/L 104   CO2 22 - 32 mmol/L 30   Calcium 8.9 - 10.3 mg/dL 9.1   Total Protein 6.5 - 8.1 g/dL 7.1   Total Bilirubin 0.3 - 1.2 mg/dL 0.4   Alkaline Phos 38 - 126 U/L 60   AST 15 - 41 U/L 13   ALT 0 - 44 U/L 21      ASSESSMENT & PLAN Samantha Solomon 51 y.o. female with medical history significant for GERD, PUD, MTHFR mutation, and OCP use who presents for evaluation of DVT provoked by a  snake bite.   After review of the labs, review of the records, and discussion with the Samantha Solomon the patients findings are most consistent with a lower extremity DVT provoked by snakebite.  A provoked venous thromboembolism (VTE) is one that has a clear inciting factor or event. Provoking factors include prolonged travel/immobility, surgery (particularly abdominal or orthopedic), trauma,  and pregnancy/ estrogen containing birth control. This Samantha Solomon was reported to have a snakebite, which would qualify as a transient provoking factor. As such we would recommend 3-6 months of anticoagulation therapy with consideration of additional therapy if symptoms persist. The anticoagulation therapy of choice in this situation is Eliquis 5 mg twice daily. The Samantha Solomon has a supply of this medication and can afford it without difficulty. We will plan to see the Samantha Solomon back in 3 months time to reassess and assure they are doing well on treatment.   # Left Lower Extremity DVT Provoked by Snake Bite --findings at this time are consistent with a provoked VTE  --will order baseline CMP and CBC to assure labs are adequate for DOAC therapy  --recommend the Samantha Solomon continue eliquis 5mg  BID for 3 to 6 months duration.  Duration of anticoagulation will depend on symptoms. --Samantha Solomon denies any bleeding, bruising, or dark stools on this medication. It is well tolerated. No difficulties accessing/affording the medication  --RTC in 3 months' time with strict return precautions for overt signs of bleeding.   Orders Placed This Encounter  Procedures   CBC with Differential (Cancer Center Only)    Standing Status:   Future    Number of Occurrences:   1    Standing Expiration Date:   01/15/2023   CMP (Cancer Center only)    Standing Status:  Future    Number of Occurrences:   1    Standing Expiration Date:   01/15/2023    All questions were answered. The Samantha Solomon knows to call the clinic with any problems, questions or  concerns.  A total of more than 60 minutes were spent on this encounter with face-to-face time and non-face-to-face time, including preparing to see the Samantha Solomon, ordering tests and/or medications, counseling the Samantha Solomon and coordination of care as outlined above.   Ledell Peoples, MD Department of Hematology/Oncology [place] [facility] Phone: [phone] Pager: [phone] Email: [email or web address]  01/25/2022 3:43 PM

## 2022-02-16 NOTE — Progress Notes (Signed)
[name] [address] [place] [address] Phone: [phone] Subjective:   Samantha Solomon, am serving as a scribe for Dr. Antoine Primas.  I'm seeing this patient by the request  of:  [name], [name], MD  CC: Neck and back pain follow-up  [id number]  Samantha Solomon is a 51 y.o. female coming in with complaint of back and neck pain. OMT 01/07/2022. Patient states that overall she has been doing relatively well.  Patient did have the pain on the left leg after the snakebite.  Doing significantly better with this especially with the compression stockings.  Is able to run and walk without any significant limp or pain.  Still does not feel like her contralateral side  Medications patient has been prescribed: Gabapentin  Taking:         Reviewed prior external information including notes and imaging from previsou exam, outside providers and external EMR if available.   As well as notes that were available from care everywhere and other healthcare systems.  Past medical history, social, surgical and family history all reviewed in electronic medical record.  No pertanent information unless stated regarding to the chief complaint.   Past Medical History:  Diagnosis Date   GERD (gastroesophageal reflux disease)    PUD (peptic ulcer disease)     No Known Allergies   Review of Systems:  No headache, visual changes, nausea, vomiting, diarrhea, constipation, dizziness, abdominal pain, skin rash, fevers, chills, night sweats, weight loss, swollen lymph nodes, body aches, joint swelling, chest pain, shortness of breath, mood changes. POSITIVE muscle aches  Objective  Blood pressure 98/72, pulse 79, height 5\' 1"  (1.549 m), weight 127 lb (57.6 kg), SpO2 98 %.   General: No apparent distress alert and oriented x3 mood and affect normal, dressed appropriately.  HEENT: Pupils equal, extraocular movements intact  Respiratory: Patient's speak in  full sentences and does not appear short of breath  Cardiovascular: No lower extremity edema, non tender, no erythema  MSK:  Back does have some loss of lordosis.  Some tenderness to palpation in the paraspinal musculature.  Very mild compared to what she was previously.  More tightness noted over the left sacroiliac joint right actually.  Osteopathic findings  C2 flexed rotated and side bent right C6 flexed rotated and side bent left T3 extended rotated and side bent right inhaled rib T9 extended rotated and side bent left L2 flexed rotated and side bent right Sacrum left on left       Assessment and Plan:  Low back pain Patient responded extremely well to osteopathic manipulation at the moment.  Palgic gait secondary to previous snakebite.  Patient seems to be doing well with compression socks.  Discussed icing regimen and home exercises and follow-up with me I 8-10 weeks    Nonallopathic problems  Decision today to treat with OMT was based on Physical Exam  After verbal consent patient was treated with HVLA, ME, FPR techniques in cervical, rib, thoracic, lumbar, and sacral  areas  Patient tolerated the procedure well with improvement in symptoms  Patient given exercises, stretches and lifestyle modifications  See medications in patient instructions if given  Patient will follow up in 4-8 weeks    The above documentation has been reviewed and is accurate and complete , DO          Note: This dictation was prepared with Dragon dictation along with smaller phrase technology. Any transcriptional errors that result  from this process are unintentional.

## 2022-02-18 ENCOUNTER — Ambulatory Visit (INDEPENDENT_AMBULATORY_CARE_PROVIDER_SITE_OTHER): Payer: 59 | Admitting: Family Medicine

## 2022-02-18 ENCOUNTER — Encounter: Payer: Self-pay | Admitting: Family Medicine

## 2022-02-18 VITALS — BP 98/72 | HR 79 | Ht 61.0 in | Wt 127.0 lb

## 2022-02-18 DIAGNOSIS — M545 Low back pain, unspecified: Secondary | ICD-10-CM | POA: Diagnosis not present

## 2022-02-18 DIAGNOSIS — M9903 Segmental and somatic dysfunction of lumbar region: Secondary | ICD-10-CM | POA: Diagnosis not present

## 2022-02-18 DIAGNOSIS — M9904 Segmental and somatic dysfunction of sacral region: Secondary | ICD-10-CM | POA: Diagnosis not present

## 2022-02-18 DIAGNOSIS — M9908 Segmental and somatic dysfunction of rib cage: Secondary | ICD-10-CM | POA: Diagnosis not present

## 2022-02-18 DIAGNOSIS — M9901 Segmental and somatic dysfunction of cervical region: Secondary | ICD-10-CM

## 2022-02-18 DIAGNOSIS — M9902 Segmental and somatic dysfunction of thoracic region: Secondary | ICD-10-CM

## 2022-02-18 NOTE — Assessment & Plan Note (Signed)
Patient responded extremely well to osteopathic manipulation at the moment.  Palgic gait secondary to previous snakebite.  Patient seems to be doing well with compression socks.  Discussed icing regimen and home exercises and follow-up with me I 8-10 weeks

## 2022-02-18 NOTE — Patient Instructions (Signed)
See me in 8-10 weeks 

## 2022-04-08 NOTE — Progress Notes (Signed)
[place] Phone: [phone] Subjective:   [name], am serving as a scribe for Dr. Hulan Saas.  I'm seeing this patient by the request  of:  [name], [name], MD  CC: Low back pain and hip pain follow-up  [id number]  Brenn Gatton is a 52 y.o. female coming in with complaint of back and neck pain. OMT 02/18/2022. Patient states that she broke her tibia on Christmas Day skiing. Patient having increase in pain in R side of lumbar spine due to using crutches.  Patient states that it is fairly severe and even the pain in the back can wake her up at night now.  Medications patient has been prescribed: None  Taking:         Reviewed prior external information including notes and imaging from previsou exam, outside providers and external EMR if available.   As well as notes that were available from care everywhere and other healthcare systems.  Past medical history, social, surgical and family history all reviewed in electronic medical record.  No pertanent information unless stated regarding to the chief complaint.   Past Medical History:  Diagnosis Date   GERD (gastroesophageal reflux disease)    PUD (peptic ulcer disease)     No Known Allergies   Review of Systems:  No headache, visual changes, nausea, vomiting, diarrhea, constipation, dizziness, abdominal pain, skin rash, fevers, chills, night sweats, weight loss, swollen lymph nodes, body aches, joint swelling, chest pain, shortness of breath, mood changes. POSITIVE muscle aches  Objective  Blood pressure 90/64, pulse 95, height 5\' 1"  (1.549 m), SpO2 99 %.   General: No apparent distress alert and oriented x3 mood and affect normal, dressed appropriately.  HEENT: Pupils equal, extraocular movements intact  Respiratory: Patient's speak in full sentences and does not appear short of breath  Cardiovascular: No lower extremity edema,  non tender, no erythema  Low back exam does have some loss lordosis.  Some tenderness to palpation in the paraspinal musculature.  Patient has severe tenderness over the right sacroiliac joint. Positive FABER test on the right.  Deferred the left side secondary to the discomfort and pain.  Osteopathic findings  C2 flexed rotated and side bent right T3 extended rotated and side bent right inhaled rib T9 extended rotated and side bent right L2 flexed rotated and side bent right L4 flexed rotated and side bent right Sacrum right on right       Assessment and Plan:  Low back pain Significant exacerbation secondary to patient being nonweightbearing on the left leg.  Discussed with patient that I do agree with the range of motion of the left leg only with nonweightbearing but otherwise continue the knee immobilizer until she gets further evaluation by orthopedics.  Due to this though patient continues to have significant amount of loading of the right sacroiliac joint.  We discussed with patient about different icing regimen, home exercises, which activities to do and which ones to avoid.  Follow-up with me again in 6 to 8 weeks.    Nonallopathic problems  Decision today to treat with OMT was based on Physical Exam  After verbal consent patient was treated with HVLA, ME, FPR techniques in cervical, rib, thoracic, lumbar, and sacral  areas  Patient tolerated the procedure well with improvement in symptoms  Patient given exercises, stretches and lifestyle modifications  See medications in patient instructions if given  Patient will follow up in 4-8  weeks     The above documentation has been reviewed and is accurate and complete Lyndal Pulley, DO         Note: This dictation was prepared with Dragon dictation along with smaller phrase technology. Any transcriptional errors that result from this process are unintentional.

## 2022-04-15 ENCOUNTER — Ambulatory Visit: Payer: 59 | Admitting: Family Medicine

## 2022-04-15 VITALS — BP 90/64 | HR 95 | Ht 61.0 in

## 2022-04-15 DIAGNOSIS — M9902 Segmental and somatic dysfunction of thoracic region: Secondary | ICD-10-CM | POA: Diagnosis not present

## 2022-04-15 DIAGNOSIS — M545 Low back pain, unspecified: Secondary | ICD-10-CM | POA: Diagnosis not present

## 2022-04-15 DIAGNOSIS — M9901 Segmental and somatic dysfunction of cervical region: Secondary | ICD-10-CM

## 2022-04-15 DIAGNOSIS — M9904 Segmental and somatic dysfunction of sacral region: Secondary | ICD-10-CM

## 2022-04-15 DIAGNOSIS — M9908 Segmental and somatic dysfunction of rib cage: Secondary | ICD-10-CM

## 2022-04-15 DIAGNOSIS — M9903 Segmental and somatic dysfunction of lumbar region: Secondary | ICD-10-CM | POA: Diagnosis not present

## 2022-04-15 NOTE — Assessment & Plan Note (Signed)
Significant exacerbation secondary to patient being nonweightbearing on the left leg.  Discussed with patient that I do agree with the range of motion of the left leg only with nonweightbearing but otherwise continue the knee immobilizer until she gets further evaluation by orthopedics.  Due to this though patient continues to have significant amount of loading of the right sacroiliac joint.  We discussed with patient about different icing regimen, home exercises, which activities to do and which ones to avoid.  Follow-up with me again in 6 to 8 weeks.

## 2022-04-15 NOTE — Patient Instructions (Signed)
Great to see you See me in 4 weeks Ok to sit without brace

## 2022-04-26 ENCOUNTER — Inpatient Hospital Stay: Payer: 59 | Attending: Hematology and Oncology

## 2022-04-26 ENCOUNTER — Other Ambulatory Visit: Payer: Self-pay

## 2022-04-26 ENCOUNTER — Inpatient Hospital Stay: Payer: 59 | Admitting: Hematology and Oncology

## 2022-04-26 ENCOUNTER — Other Ambulatory Visit: Payer: Self-pay | Admitting: Hematology and Oncology

## 2022-04-26 VITALS — BP 108/61 | HR 67 | Temp 97.8°F | Resp 16 | Ht 61.0 in

## 2022-04-26 DIAGNOSIS — I82492 Acute embolism and thrombosis of other specified deep vein of left lower extremity: Secondary | ICD-10-CM

## 2022-04-26 DIAGNOSIS — Y9323 Activity, snow (alpine) (downhill) skiing, snow boarding, sledding, tobogganing and snow tubing: Secondary | ICD-10-CM | POA: Diagnosis not present

## 2022-04-26 DIAGNOSIS — Z7901 Long term (current) use of anticoagulants: Secondary | ICD-10-CM | POA: Diagnosis not present

## 2022-04-26 DIAGNOSIS — I82402 Acute embolism and thrombosis of unspecified deep veins of left lower extremity: Secondary | ICD-10-CM | POA: Insufficient documentation

## 2022-04-26 DIAGNOSIS — S82202A Unspecified fracture of shaft of left tibia, initial encounter for closed fracture: Secondary | ICD-10-CM | POA: Diagnosis not present

## 2022-04-26 LAB — CMP (CANCER CENTER ONLY)
ALT: 17 U/L (ref 0–44)
AST: 17 U/L (ref 15–41)
Albumin: 4.2 g/dL (ref 3.5–5.0)
Alkaline Phosphatase: 62 U/L (ref 38–126)
Anion gap: 7 (ref 5–15)
BUN: 11 mg/dL (ref 6–20)
CO2: 28 mmol/L (ref 22–32)
Calcium: 9.6 mg/dL (ref 8.9–10.3)
Chloride: 103 mmol/L (ref 98–111)
Creatinine: 0.74 mg/dL (ref 0.44–1.00)
GFR, Estimated: >60 mL/min (ref >60)
Glucose, Bld: 69 mg/dL — ABNORMAL LOW (ref 70–99)
Potassium: 3.8 mmol/L (ref 3.5–5.1)
Sodium: 138 mmol/L (ref 135–145)
Total Bilirubin: 0.4 mg/dL (ref 0.3–1.2)
Total Protein: 6.7 g/dL (ref 6.5–8.1)

## 2022-04-26 LAB — CBC WITH DIFFERENTIAL (CANCER CENTER ONLY)
Abs Immature Granulocytes: 0.01 10*3/uL (ref 0.00–0.07)
Basophils Absolute: 0 10*3/uL (ref 0.0–0.1)
Basophils Relative: 0 %
Eosinophils Absolute: 0.3 10*3/uL (ref 0.0–0.5)
Eosinophils Relative: 5 %
HCT: 34.8 % — ABNORMAL LOW (ref 36.0–46.0)
Hemoglobin: 11.9 g/dL — ABNORMAL LOW (ref 12.0–15.0)
Immature Granulocytes: 0 %
Lymphocytes Relative: 26 %
Lymphs Abs: 1.3 10*3/uL (ref 0.7–4.0)
MCH: 29.2 pg (ref 26.0–34.0)
MCHC: 34.2 g/dL (ref 30.0–36.0)
MCV: 85.5 fL (ref 80.0–100.0)
Monocytes Absolute: 0.4 10*3/uL (ref 0.1–1.0)
Monocytes Relative: 8 %
Neutro Abs: 3 10*3/uL (ref 1.7–7.7)
Neutrophils Relative %: 61 %
Platelet Count: 242 10*3/uL (ref 150–400)
RBC: 4.07 MIL/uL (ref 3.87–5.11)
RDW: 12.3 % (ref 11.5–15.5)
WBC Count: 5 10*3/uL (ref 4.0–10.5)
nRBC: 0 % (ref 0.0–0.2)

## 2022-04-26 MED ORDER — APIXABAN 5 MG PO TABS: 5.0000 mg | ORAL_TABLET | Freq: Two times a day (BID) | ORAL | 0 refills | Status: DC

## 2022-04-26 NOTE — Progress Notes (Signed)
[facility] [phone]   [phone]  PROGRESS NOTE  Patient Care Team: [name], Meredith Mody, MD as PCP - General (Family Medicine) Jake Bathe, MD as PCP - Cardiology (Cardiology)  Hematological/Oncological History # Left Lower Extremity DVT Provoked by Snake Bite 12/01/2021: emergency department visit for snake bite and LLE pain. Received antivenom.  12/09/2021: returned to ED with worsening leg pain. DVT ultrasound obtained and did show occlusive thrombi throughout the lower leg, but no DVTs above the knee. Started on elqiuis.  01/14/2022: establish care with Dr. Leonides Schanz  03/28/2022: tibial fracture during skiing incident.    Interval History:  Samantha Solomon 52 y.o. female with medical history significant for LLE DVT from a snake bite who presents for a follow up visit. The patient's last visit was on 01/14/2022. In the interim since the last visit she broke her leg on Christmas Day during a skiing trip.  On exam today Samantha Solomon reports that she was skiing in [place] when she broke her leg.  It was the same leg as she had the blood clot.  There is a fracture in her tibial plateau.  She is currently wearing a brace and will be wearing it for a total of 6 to 8 weeks.  She notes that she continues to take her Eliquis therapy and is tolerating it well.  She is not having any bleeding, bruising, or dark stools.  She has not noticed any problems with the cost of the medication.  She reports that she is otherwise at her baseline level of health.  She is having some swelling in her legs though it was just fractured.  We discussed plan moving forward.  She was agreeable to continuation of anticoagulation therapy for an additional 3 months while she is in the cast and recovering from her fracture.  She otherwise denies any fevers, chills, sweats, nausea, vomiting or diarrhea.  A full 10 point ROS is otherwise negative.  MEDICAL HISTORY:  Past Medical History:   Diagnosis Date   GERD (gastroesophageal reflux disease)    PUD (peptic ulcer disease)     SURGICAL HISTORY: Past Surgical History:  Procedure Laterality Date   ABDOMINAL HYSTERECTOMY     APPENDECTOMY     CESAREAN SECTION      SOCIAL HISTORY: Social History   Socioeconomic History   Marital status: Married    Spouse name: Not on file   Number of children: Not on file   Years of education: Not on file   Highest education level: Not on file  Occupational History   Not on file  Tobacco Use   Smoking status: Never   Smokeless tobacco: Never  Substance and Sexual Activity   Alcohol use: Not Currently   Drug use: Never   Sexual activity: Not on file  Other Topics Concern   Not on file  Social History Narrative   Not on file   Social Determinants of Health   Financial Resource Strain: Not on file  Food Insecurity: Not on file  Transportation Needs: Not on file  Physical Activity: Not on file  Stress: Not on file  Social Connections: Not on file  Intimate Partner Violence: Not on file    FAMILY HISTORY: Family History  Problem Relation Age of Onset   Heart attack Paternal Grandfather        In his 105s    ALLERGIES:  has No Known Allergies.  MEDICATIONS:  Current Outpatient Medications  Medication Sig Dispense Refill  apixaban (ELIQUIS) 5 MG TABS tablet Take 1 tablet (5 mg total) by mouth 2 (two) times daily. 180 tablet 0   CALCIUM CARB-CHOLECALCIFEROL PO Take by mouth.     finasteride (PROSCAR) 5 MG tablet Take 5 mg by mouth daily.     LINZESS 290 MCG CAPS capsule Take 290 mcg by mouth daily.     pantoprazole (PROTONIX) 40 MG tablet Take 1 tablet by mouth daily.     Rhubarb (ESTROVEN COMPLETE PO) Take by mouth.     No current facility-administered medications for this visit.    REVIEW OF SYSTEMS:   Constitutional: ( - ) fevers, ( - )  chills , ( - ) night sweats Eyes: ( - ) blurriness of vision, ( - ) double vision, ( - ) watery eyes Ears, nose,  mouth, throat, and face: ( - ) mucositis, ( - ) sore throat Respiratory: ( - ) cough, ( - ) dyspnea, ( - ) wheezes Cardiovascular: ( - ) palpitation, ( - ) chest discomfort, ( - ) lower extremity swelling Gastrointestinal:  ( - ) nausea, ( - ) heartburn, ( - ) change in bowel habits Skin: ( - ) abnormal skin rashes Lymphatics: ( - ) new lymphadenopathy, ( - ) easy bruising Neurological: ( - ) numbness, ( - ) tingling, ( - ) new weaknesses Behavioral/Psych: ( - ) mood change, ( - ) new changes  All other systems were reviewed with the patient and are negative.  PHYSICAL EXAMINATION:  Vitals:   04/26/22 1014  BP: 108/61  Pulse: 67  Resp: 16  Temp: 97.8 F (36.6 C)  SpO2: 100%   Filed Weights    GENERAL: Well-appearing middle-age Caucasian female, alert, no distress and comfortable SKIN: skin color, texture, turgor are normal, no rashes or significant lesions EYES: conjunctiva are pink and non-injected, sclera clear LUNGS: clear to auscultation and percussion with normal breathing effort HEART: regular rate & rhythm and no murmurs and no lower extremity edema Musculoskeletal: no cyanosis of digits and no clubbing  PSYCH: alert & oriented x 3, fluent speech NEURO: no focal motor/sensory deficits  LABORATORY DATA:  I have reviewed the data as listed    Latest Ref Rng & Units 04/26/2022   10:02 AM 01/14/2022   10:21 AM  CBC  WBC 4.0 - 10.5 K/uL 5.0  5.9   Hemoglobin 12.0 - 15.0 g/dL 11.9  12.0   Hematocrit 36.0 - 46.0 % 34.8  35.9   Platelets 150 - 400 K/uL 242  257        Latest Ref Rng & Units 04/26/2022   10:02 AM 01/14/2022   10:21 AM  CMP  Glucose 70 - 99 mg/dL 69  93   BUN 6 - 20 mg/dL 11  11   Creatinine 0.44 - 1.00 mg/dL 0.74  0.70   Sodium 135 - 145 mmol/L 138  138   Potassium 3.5 - 5.1 mmol/L 3.8  3.6   Chloride 98 - 111 mmol/L 103  104   CO2 22 - 32 mmol/L 28  30   Calcium 8.9 - 10.3 mg/dL 9.6  9.1   Total Protein 6.5 - 8.1 g/dL 6.7  7.1   Total  Bilirubin 0.3 - 1.2 mg/dL 0.4  0.4   Alkaline Phos 38 - 126 U/L 62  60   AST 15 - 41 U/L 17  13   ALT 0 - 44 U/L 17  21     RADIOGRAPHIC STUDIES: No results found.  ASSESSMENT & PLAN Samantha Solomon 51 y.o. female with medical history significant for LLE DVT from a snake bite who presents for a follow up visit.   After review of the labs, review of the records, and discussion with the patient the patients findings are most consistent with a lower extremity DVT provoked by snakebite.   A provoked venous thromboembolism (VTE) is one that has a clear inciting factor or event. Provoking factors include prolonged travel/immobility, surgery (particularly abdominal or orthopedic), trauma,  and pregnancy/ estrogen containing birth control. This patient was reported to have a snakebite, which would qualify as a transient provoking factor. As such we would recommend 3-6 months of anticoagulation therapy with consideration of additional therapy if symptoms persist. The anticoagulation therapy of choice in this situation is Eliquis 5 mg twice daily. The patient has a supply of this medication and can afford it without difficulty. We will plan to see the patient back in 3 months time to reassess and assure they are doing well on treatment.    # Left Lower Extremity DVT Provoked by Snake Bite # Tibial Fracture  --findings at this time are consistent with a provoked VTE  -- CMP and CBC are adequate for DOAC therapy  --recommend the patient continue eliquis 5mg  BID for a full 6 months duration. This is due to her recent tibial fracture and limited mobility in a leg brace --patient denies any bleeding, bruising, or dark stools on this medication. It is well tolerated. No difficulties accessing/affording the medication  --Labs today show white blood cell count 5.0, hemoglobin 9.9, MCV 85.5, and platelets of 242.  Creatinine 0.74 with normal LFTs. --RTC in 3 months' time with strict return precautions for overt  signs of bleeding.   No orders of the defined types were placed in this encounter.  All questions were answered. The patient knows to call the clinic with any problems, questions or concerns.  A total of more than 30 minutes were spent on this encounter with face-to-face time and non-face-to-face time, including preparing to see the patient, ordering tests and/or medications, counseling the patient and coordination of care as outlined above.   Ledell Peoples, MD Department of Hematology/Oncology [place] [facility] Phone: [phone] Pager: [phone] Email: [email or web address]  04/26/2022 10:58 AM

## 2022-05-12 NOTE — Progress Notes (Signed)
  [place] Phone: [phone] Subjective:   [name], am serving as a scribe for Dr. Hulan Saas.  I'm seeing this patient by the request  of:  [name], [name], MD  CC: Back and neck pain follow-up  [id number]  Samantha Solomon is a 52 y.o. female coming in with complaint of back and neck pain. OMT 04/15/2021. Patient states that her back pain has improved. Able to walk without a cast.  Patient is having some back pain but nothing severe though.  Medications patient has been prescribed: None  Taking:         Reviewed prior external information including notes and imaging from previsou exam, outside providers and external EMR if available.   As well as notes that were available from care everywhere and other healthcare systems.  Past medical history, social, surgical and family history all reviewed in electronic medical record.  No pertanent information unless stated regarding to the chief complaint.   Past Medical History:  Diagnosis Date   GERD (gastroesophageal reflux disease)    PUD (peptic ulcer disease)     No Known Allergies   Review of Systems:  No headache, visual changes, nausea, vomiting, diarrhea, constipation, dizziness, abdominal pain, skin rash, fevers, chills, night sweats, weight loss, swollen lymph nodes, body aches, joint swelling, chest pain, shortness of breath, mood changes. POSITIVE muscle aches  Objective  Blood pressure 100/62, pulse 72, height 5\' 1"  (1.549 m), weight 127 lb (57.6 kg), SpO2 98 %.   General: No apparent distress alert and oriented x3 mood and affect normal, dressed appropriately.  HEENT: Pupils equal, extraocular movements intact  Respiratory: Patient's speak in full sentences and does not appear short of breath  Cardiovascular: No lower extremity edema, non tender, no erythema  Low back exam does have some loss of lordosis noted.  Some  tenderness to palpation in the paraspinal musculature.  Patient does have a fairly severe antalgic gait noted.  Osteopathic findings  C2 flexed rotated and side bent right C6 flexed rotated and side bent left T9 extended rotated and side bent left L2 flexed rotated and side bent right L4 flexed rotated and side bent left Sacrum right on right       Assessment and Plan:  Low back pain Attempted osteopathic manipulation.  Patient did respond well after this and decided to do the manipulation after evaluation today.  Discussed icing regimen and home exercises, which activities to do and which ones to avoid, increase activity slowly otherwise.  Hopefully with patient starting to increase activity she will be able to do more the stretches.  Follow-up again in 6 to 8 weeks    Nonallopathic problems  Decision today to treat with OMT was based on Physical Exam  After verbal consent patient was treated with HVLA, ME, FPR techniques in cervical, rib, thoracic, lumbar, and sacral  areas  Patient tolerated the procedure well with improvement in symptoms  Patient given exercises, stretches and lifestyle modifications  See medications in patient instructions if given  Patient will follow up in 4-8 weeks     The above documentation has been reviewed and is accurate and complete Lyndal Pulley, DO         Note: This dictation was prepared with Dragon dictation along with smaller phrase technology. Any transcriptional errors that result from this process are unintentional.

## 2022-05-16 ENCOUNTER — Encounter: Payer: Self-pay | Admitting: Family Medicine

## 2022-05-16 ENCOUNTER — Ambulatory Visit: Payer: 59 | Admitting: Family Medicine

## 2022-05-16 VITALS — BP 100/62 | HR 72 | Ht 61.0 in | Wt 127.0 lb

## 2022-05-16 DIAGNOSIS — M9904 Segmental and somatic dysfunction of sacral region: Secondary | ICD-10-CM | POA: Diagnosis not present

## 2022-05-16 DIAGNOSIS — M545 Low back pain, unspecified: Secondary | ICD-10-CM

## 2022-05-16 DIAGNOSIS — M9902 Segmental and somatic dysfunction of thoracic region: Secondary | ICD-10-CM | POA: Diagnosis not present

## 2022-05-16 DIAGNOSIS — M9901 Segmental and somatic dysfunction of cervical region: Secondary | ICD-10-CM

## 2022-05-16 DIAGNOSIS — M9903 Segmental and somatic dysfunction of lumbar region: Secondary | ICD-10-CM | POA: Diagnosis not present

## 2022-05-16 NOTE — Assessment & Plan Note (Signed)
Attempted osteopathic manipulation.  Patient did respond well after this and decided to do the manipulation after evaluation today.  Discussed icing regimen and home exercises, which activities to do and which ones to avoid, increase activity slowly otherwise.  Hopefully with patient starting to increase activity she will be able to do more the stretches.  Follow-up again in 6 to 8 weeks

## 2022-06-15 ENCOUNTER — Ambulatory Visit: Payer: 59 | Admitting: Family Medicine

## 2022-07-11 ENCOUNTER — Ambulatory Visit: Payer: 59 | Admitting: Family Medicine

## 2022-07-14 ENCOUNTER — Encounter: Payer: Self-pay | Admitting: Family Medicine

## 2022-07-20 ENCOUNTER — Ambulatory Visit: Payer: 59 | Admitting: Family Medicine

## 2022-07-22 ENCOUNTER — Telehealth: Payer: Self-pay | Admitting: Hematology and Oncology

## 2022-07-22 NOTE — Telephone Encounter (Signed)
Patient called to cancel upcoming appointments, patient will call back to reschedule. 

## 2022-07-26 ENCOUNTER — Other Ambulatory Visit: Payer: 59

## 2022-07-26 ENCOUNTER — Ambulatory Visit: Payer: 59 | Admitting: Hematology and Oncology

## 2022-08-09 NOTE — Progress Notes (Unsigned)
  [facility] [address] [place] [address] Phone: [phone] Subjective:   Samantha Solomon, am serving as a scribe for Dr. Antoine Primas.  I'm seeing this patient by the request  of:  [name], [name], MD  CC: Back and neck pain follow-up  [id number]  Samantha Solomon is a 52 y.o. female coming in with complaint of back and neck pain. OMT 05/16/2022. Patient states doing well. Follow up on fx as well. No new concerns.  Medications patient has been prescribed: None  Taking:         Reviewed prior external information including notes and imaging from previsou exam, outside providers and external EMR if available.   As well as notes that were available from care everywhere and other healthcare systems.  Past medical history, social, surgical and family history all reviewed in electronic medical record.  No pertanent information unless stated regarding to the chief complaint.   Past Medical History:  Diagnosis Date   GERD (gastroesophageal reflux disease)    PUD (peptic ulcer disease)     No Known Allergies   Review of Systems:  No headache, visual changes, nausea, vomiting, diarrhea, constipation, dizziness, abdominal pain, skin rash, fevers, chills, night sweats, weight loss, swollen lymph nodes, body aches, joint swelling, chest pain, shortness of breath, mood changes. POSITIVE muscle aches  Objective  Blood pressure 112/68, pulse 79, height 5\' 1"  (1.549 m), weight 119 lb (54 kg), SpO2 98 %.   General: No apparent distress alert and oriented x3 mood and affect normal, dressed appropriately.  HEENT: Pupils equal, extraocular movements intact  Respiratory: Patient's speak in full sentences and does not appear short of breath  Cardiovascular: No lower extremity edema, non tender, no erythema  Low back exam does have some loss lordosis noted.  Some tenderness to palpation in the paraspinal muscles tightness around the right  sacroiliac joint.  More increasing tightness noted in the left paraspinal musculature on the lumbar spine.  Osteopathic findings  C2 flexed rotated and side bent right C6 flexed rotated and side bent left T3 extended rotated and side bent right inhaled rib T9 extended rotated and side bent left L2 flexed rotated and side bent left L5 flexed rotated and side bent left Sacrum right on right       Assessment and Plan:  Low back pain Low back exam does have some loss of lordosis noted still.  Patient is starting to ambulate more and I do think that this will be more beneficial.  Discussed icing regimen and home exercises.  Discussed core strengthening.  Patient is still healing from the tibial plateau fracture when she does increase activity acute anticipated doing well.    Nonallopathic problems  Decision today to treat with OMT was based on Physical Exam  After verbal consent patient was treated with HVLA, ME, FPR techniques in cervical, rib, thoracic, lumbar, and sacral  areas  Patient tolerated the procedure well with improvement in symptoms  Patient given exercises, stretches and lifestyle modifications  See medications in patient instructions if given  Patient will follow up in 4-8 weeks      The above documentation has been reviewed and is accurate and complete Samantha Saa, DO        Note: This dictation was prepared with Dragon dictation along with smaller phrase technology. Any transcriptional errors that result from this process are unintentional.

## 2022-08-10 ENCOUNTER — Encounter: Payer: Self-pay | Admitting: Family Medicine

## 2022-08-10 ENCOUNTER — Ambulatory Visit (INDEPENDENT_AMBULATORY_CARE_PROVIDER_SITE_OTHER): Payer: Self-pay | Admitting: Family Medicine

## 2022-08-10 VITALS — BP 112/68 | HR 79 | Ht 61.0 in | Wt 119.0 lb

## 2022-08-10 DIAGNOSIS — M9902 Segmental and somatic dysfunction of thoracic region: Secondary | ICD-10-CM

## 2022-08-10 DIAGNOSIS — M9901 Segmental and somatic dysfunction of cervical region: Secondary | ICD-10-CM

## 2022-08-10 DIAGNOSIS — M9904 Segmental and somatic dysfunction of sacral region: Secondary | ICD-10-CM

## 2022-08-10 DIAGNOSIS — M9903 Segmental and somatic dysfunction of lumbar region: Secondary | ICD-10-CM

## 2022-08-10 DIAGNOSIS — M9908 Segmental and somatic dysfunction of rib cage: Secondary | ICD-10-CM

## 2022-08-10 DIAGNOSIS — M545 Low back pain, unspecified: Secondary | ICD-10-CM

## 2022-08-10 NOTE — Assessment & Plan Note (Signed)
Low back exam does have some loss of lordosis noted still.  Patient is starting to ambulate more and I do think that this will be more beneficial.  Discussed icing regimen and home exercises.  Discussed core strengthening.  Patient is still healing from the tibial plateau fracture when she does increase activity acute anticipated doing well.

## 2022-08-22 ENCOUNTER — Telehealth: Payer: Self-pay | Admitting: Hematology and Oncology

## 2022-09-09 ENCOUNTER — Encounter: Payer: Self-pay | Admitting: Family Medicine

## 2022-09-29 ENCOUNTER — Inpatient Hospital Stay: Payer: 59

## 2022-09-29 ENCOUNTER — Inpatient Hospital Stay: Payer: 59 | Admitting: Hematology and Oncology

## 2022-09-29 NOTE — Progress Notes (Signed)
Rescheduled

## 2022-10-04 ENCOUNTER — Ambulatory Visit: Payer: Self-pay | Admitting: Family Medicine

## 2022-10-11 ENCOUNTER — Other Ambulatory Visit: Payer: Self-pay | Admitting: Physician Assistant

## 2022-10-11 DIAGNOSIS — I82492 Acute embolism and thrombosis of other specified deep vein of left lower extremity: Secondary | ICD-10-CM

## 2022-10-12 ENCOUNTER — Inpatient Hospital Stay: Payer: 59 | Attending: Physician Assistant

## 2022-10-12 ENCOUNTER — Inpatient Hospital Stay (HOSPITAL_BASED_OUTPATIENT_CLINIC_OR_DEPARTMENT_OTHER): Payer: 59 | Admitting: Physician Assistant

## 2022-10-12 VITALS — BP 115/68 | HR 74 | Temp 97.9°F | Resp 20 | Wt 120.5 lb

## 2022-10-12 DIAGNOSIS — I82492 Acute embolism and thrombosis of other specified deep vein of left lower extremity: Secondary | ICD-10-CM

## 2022-10-12 DIAGNOSIS — S82209A Unspecified fracture of shaft of unspecified tibia, initial encounter for closed fracture: Secondary | ICD-10-CM | POA: Diagnosis not present

## 2022-10-12 DIAGNOSIS — Z86718 Personal history of other venous thrombosis and embolism: Secondary | ICD-10-CM | POA: Diagnosis not present

## 2022-10-12 DIAGNOSIS — X58XXXA Exposure to other specified factors, initial encounter: Secondary | ICD-10-CM | POA: Insufficient documentation

## 2022-10-12 LAB — CBC WITH DIFFERENTIAL (CANCER CENTER ONLY)
Abs Immature Granulocytes: 0.02 10*3/uL (ref 0.00–0.07)
Basophils Absolute: 0 10*3/uL (ref 0.0–0.1)
Basophils Relative: 0 %
Eosinophils Absolute: 0.2 10*3/uL (ref 0.0–0.5)
Eosinophils Relative: 4 %
HCT: 36.3 % (ref 36.0–46.0)
Hemoglobin: 12.3 g/dL (ref 12.0–15.0)
Immature Granulocytes: 0 %
Lymphocytes Relative: 30 %
Lymphs Abs: 2 10*3/uL (ref 0.7–4.0)
MCH: 29 pg (ref 26.0–34.0)
MCHC: 33.9 g/dL (ref 30.0–36.0)
MCV: 85.6 fL (ref 80.0–100.0)
Monocytes Absolute: 0.6 10*3/uL (ref 0.1–1.0)
Monocytes Relative: 8 %
Neutro Abs: 3.8 10*3/uL (ref 1.7–7.7)
Neutrophils Relative %: 58 %
Platelet Count: 296 10*3/uL (ref 150–400)
RBC: 4.24 MIL/uL (ref 3.87–5.11)
RDW: 12.5 % (ref 11.5–15.5)
WBC Count: 6.7 10*3/uL (ref 4.0–10.5)
nRBC: 0 % (ref 0.0–0.2)

## 2022-10-12 LAB — CMP (CANCER CENTER ONLY)
ALT: 16 U/L (ref 0–44)
AST: 15 U/L (ref 15–41)
Albumin: 4.2 g/dL (ref 3.5–5.0)
Alkaline Phosphatase: 62 U/L (ref 38–126)
Anion gap: 5 (ref 5–15)
BUN: 11 mg/dL (ref 6–20)
CO2: 30 mmol/L (ref 22–32)
Calcium: 9.6 mg/dL (ref 8.9–10.3)
Chloride: 104 mmol/L (ref 98–111)
Creatinine: 0.77 mg/dL (ref 0.44–1.00)
GFR, Estimated: >60 mL/min (ref >60)
Glucose, Bld: 85 mg/dL (ref 70–99)
Potassium: 4.2 mmol/L (ref 3.5–5.1)
Sodium: 139 mmol/L (ref 135–145)
Total Bilirubin: 0.3 mg/dL (ref 0.3–1.2)
Total Protein: 7.2 g/dL (ref 6.5–8.1)

## 2022-10-12 NOTE — Progress Notes (Signed)
[facility] [phone]   [phone]  PROGRESS NOTE  Patient Care Team: [name], Meredith Mody, MD as PCP - General (Family Medicine) Jake Bathe, MD as PCP - Cardiology (Cardiology)  Hematological/Oncological History # Left Lower Extremity DVT Provoked by Snake Bite 12/01/2021: emergency department visit for snake bite and LLE pain. Received antivenom.  12/09/2021: returned to ED with worsening leg pain. DVT ultrasound obtained and did show occlusive thrombi throughout the lower leg, but no DVTs above the knee. Started on elqiuis.  01/14/2022: establish care with Dr. Leonides Schanz  03/28/2022: tibial fracture during skiing incident.    Interval History:  Samantha Solomon 52 y.o. female with medical history significant for LLE DVT from a snake bite who presents for a follow up visit. The patient's last visit was on 04/26/2022. In the interim since the last visit completed 6 months of eliquis therapy and discontinued last month.   On exam today Samantha Solomon reports that she is doing well without any concern for new DVT. She has noticed some hyperpigmentation of her left leg near the snake bite. She denies any swelling or pain in her legs. She denies fevers, chills, sweats, shortness of breath, chest pain, cough, nausea, vomiting or bowel habit changes.   A full 10 point ROS is otherwise negative.  MEDICAL HISTORY:  Past Medical History:  Diagnosis Date   GERD (gastroesophageal reflux disease)    PUD (peptic ulcer disease)     SURGICAL HISTORY: Past Surgical History:  Procedure Laterality Date   ABDOMINAL HYSTERECTOMY     APPENDECTOMY     CESAREAN SECTION      SOCIAL HISTORY: Social History   Socioeconomic History   Marital status: Married    Spouse name: Not on file   Number of children: Not on file   Years of education: Not on file   Highest education level: Not on file  Occupational History   Not on file  Tobacco Use   Smoking status: Never    Smokeless tobacco: Never  Substance and Sexual Activity   Alcohol use: Not Currently   Drug use: Never   Sexual activity: Not on file  Other Topics Concern   Not on file  Social History Narrative   Not on file   Social Determinants of Health   Financial Resource Strain: Not on file  Food Insecurity: Not on file  Transportation Needs: Not on file  Physical Activity: Not on file  Stress: Not on file  Social Connections: Not on file  Intimate Partner Violence: Not on file    FAMILY HISTORY: Family History  Problem Relation Age of Onset   Heart attack Paternal Grandfather        In his 46s    ALLERGIES:  has No Known Allergies.  MEDICATIONS:  Current Outpatient Medications  Medication Sig Dispense Refill   CALCIUM CARB-CHOLECALCIFEROL PO Take by mouth.     Cholecalciferol (VITAMIN D-3 PO) Take by mouth daily.     esomeprazole (NEXIUM) 40 MG capsule Take 40 mg by mouth every morning.     Rhubarb (ESTROVEN COMPLETE PO) Take by mouth.     apixaban (ELIQUIS) 5 MG TABS tablet Take 1 tablet (5 mg total) by mouth 2 (two) times daily. (Patient not taking: Reported on 10/12/2022) 180 tablet 0   finasteride (PROSCAR) 5 MG tablet Take 5 mg by mouth daily. (Patient not taking: Reported on 10/12/2022)     LINZESS 290 MCG CAPS capsule Take 290 mcg by mouth daily. (  Patient not taking: Reported on 10/12/2022)     pantoprazole (PROTONIX) 40 MG tablet Take 1 tablet by mouth daily. (Patient not taking: Reported on 10/12/2022)     No current facility-administered medications for this visit.    REVIEW OF SYSTEMS:   Constitutional: ( - ) fevers, ( - )  chills , ( - ) night sweats Eyes: ( - ) blurriness of vision, ( - ) double vision, ( - ) watery eyes Ears, nose, mouth, throat, and face: ( - ) mucositis, ( - ) sore throat Respiratory: ( - ) cough, ( - ) dyspnea, ( - ) wheezes Cardiovascular: ( - ) palpitation, ( - ) chest discomfort, ( - ) lower extremity swelling Gastrointestinal:  ( - )  nausea, ( - ) heartburn, ( - ) change in bowel habits Skin: ( - ) abnormal skin rashes Lymphatics: ( - ) new lymphadenopathy, ( - ) easy bruising Neurological: ( - ) numbness, ( - ) tingling, ( - ) new weaknesses Behavioral/Psych: ( - ) mood change, ( - ) new changes  All other systems were reviewed with the patient and are negative.  PHYSICAL EXAMINATION:  Vitals:   10/12/22 1344  BP: 115/68  Pulse: 74  Resp: 20  Temp: 97.9 F (36.6 C)  SpO2: 95%   Filed Weights   10/12/22 1344  Weight: 120 lb 8 oz (54.7 kg)    GENERAL: Well-appearing middle-age Caucasian female, alert, no distress and comfortable SKIN: skin color, texture, turgor are normal, no rashes or significant lesions EYES: conjunctiva are pink and non-injected, sclera clear LUNGS: clear to auscultation and percussion with normal breathing effort HEART: regular rate & rhythm and no murmurs and no lower extremity edema Musculoskeletal: no cyanosis of digits and no clubbing  PSYCH: alert & oriented x 3, fluent speech NEURO: no focal motor/sensory deficits  LABORATORY DATA:  I have reviewed the data as listed    Latest Ref Rng & Units 10/12/2022    1:19 PM 04/26/2022   10:02 AM 01/14/2022   10:21 AM  CBC  WBC 4.0 - 10.5 K/uL 6.7  5.0  5.9   Hemoglobin 12.0 - 15.0 g/dL 40.9  81.1  91.4   Hematocrit 36.0 - 46.0 % 36.3  34.8  35.9   Platelets 150 - 400 K/uL 296  242  257        Latest Ref Rng & Units 10/12/2022    1:19 PM 04/26/2022   10:02 AM 01/14/2022   10:21 AM  CMP  Glucose 70 - 99 mg/dL 85  69  93   BUN 6 - 20 mg/dL 11  11  11    Creatinine 0.44 - 1.00 mg/dL 7.82  9.56  2.13   Sodium 135 - 145 mmol/L 139  138  138   Potassium 3.5 - 5.1 mmol/L 4.2  3.8  3.6   Chloride 98 - 111 mmol/L 104  103  104   CO2 22 - 32 mmol/L 30  28  30    Calcium 8.9 - 10.3 mg/dL 9.6  9.6  9.1   Total Protein 6.5 - 8.1 g/dL 7.2  6.7  7.1   Total Bilirubin 0.3 - 1.2 mg/dL 0.3  0.4  0.4   Alkaline Phos 38 - 126 U/L 62  62  60    AST 15 - 41 U/L 15  17  13    ALT 0 - 44 U/L 16  17  21      RADIOGRAPHIC STUDIES: No results found.  ASSESSMENT & PLAN Samantha Solomon is a 52 y.o. female with medical history significant for LLE DVT from a snake bite who presents for a follow up visit.   # Left Lower Extremity DVT Provoked by Snake Bite # Tibial Fracture  --findings at this time are consistent with a provoked VTE  --Completed 6 months of eliquis 5mg  BID in June 2024.  --Labs from today were reviewed and require no intervention. CBC and CMP are unremarkable.  --Strict precautions to follow up with [place] if she is concerned for a new DVT.  --Return to clinic as needed.   No orders of the defined types were placed in this encounter.  All questions were answered. The patient knows to call the clinic with any problems, questions or concerns.  I have spent a total of 25 minutes minutes of face-to-face and non-face-to-face time, preparing to see the patient, performing a medically appropriate examination, counseling and educating the patient, documenting clinical information in the electronic health record, and care coordination.   Georga Kaufmann PA-C Dept of Hematology and Oncology [facility] Phone: [phone]   10/12/2022 4:03 PM

## 2022-11-03 NOTE — Progress Notes (Deleted)
  [name] [address] [place] [address] Phone: [phone] Subjective:    I'm seeing this patient by the request  of:  [name], [name], MD  CC:   [id number]  Samantha Solomon is a 52 y.o. female coming in with complaint of back and neck pain. OMT on 08/10/2022. Patient states   Medications patient has been prescribed:   Taking:         Reviewed prior external information including notes and imaging from previsou exam, outside providers and external EMR if available.   As well as notes that were available from care everywhere and other healthcare systems.  Past medical history, social, surgical and family history all reviewed in electronic medical record.  No pertanent information unless stated regarding to the chief complaint.   Past Medical History:  Diagnosis Date   GERD (gastroesophageal reflux disease)    PUD (peptic ulcer disease)     No Known Allergies   Review of Systems:  No headache, visual changes, nausea, vomiting, diarrhea, constipation, dizziness, abdominal pain, skin rash, fevers, chills, night sweats, weight loss, swollen lymph nodes, body aches, joint swelling, chest pain, shortness of breath, mood changes. POSITIVE muscle aches  Objective  There were no vitals taken for this visit.   General: No apparent distress alert and oriented x3 mood and affect normal, dressed appropriately.  HEENT: Pupils equal, extraocular movements intact  Respiratory: Patient's speak in full sentences and does not appear short of breath  Cardiovascular: No lower extremity edema, non tender, no erythema  Gait MSK:  Back   Osteopathic findings  C2 flexed rotated and side bent right C6 flexed rotated and side bent left T3 extended rotated and side bent right inhaled rib T9 extended rotated and side bent left L2 flexed rotated and side bent right Sacrum right on right       Assessment and Plan:  No problem-specific  Assessment & Plan notes found for this encounter.    Nonallopathic problems  Decision today to treat with OMT was based on Physical Exam  After verbal consent patient was treated with HVLA, ME, FPR techniques in cervical, rib, thoracic, lumbar, and sacral  areas  Patient tolerated the procedure well with improvement in symptoms  Patient given exercises, stretches and lifestyle modifications  See medications in patient instructions if given  Patient will follow up in 4-8 weeks             Note: This dictation was prepared with Dragon dictation along with smaller phrase technology. Any transcriptional errors that result from this process are unintentional.

## 2022-11-09 ENCOUNTER — Ambulatory Visit: Payer: Self-pay | Admitting: Family Medicine

## 2022-12-08 NOTE — Progress Notes (Signed)
  [facility] [address] [place] [address] Phone: [phone] Subjective:   Samantha Solomon, am serving as a scribe for Dr. Antoine Primas.  I'm seeing this patient by the request  of:  [name], [name], MD  CC: Back and neck pain follow-up  [id number]  Samantha Solomon is a 52 y.o. female coming in with complaint of back and neck pain. OMT 08/10/2022. Patient states that she does have pain overall but more stiffness.   Medications patient has been prescribed: None  Taking:         Reviewed prior external information including notes and imaging from previsou exam, outside providers and external EMR if available.   As well as notes that were available from care everywhere and other healthcare systems.  Past medical history, social, surgical and family history all reviewed in electronic medical record.  No pertanent information unless stated regarding to the chief complaint.   Past Medical History:  Diagnosis Date   GERD (gastroesophageal reflux disease)    PUD (peptic ulcer disease)     No Known Allergies   Review of Systems:  No headache, visual changes, nausea, vomiting, diarrhea, constipation, dizziness, abdominal pain, skin rash, fevers, chills, night sweats, weight loss, swollen lymph nodes, body aches, joint swelling, chest pain, shortness of breath, mood changes. POSITIVE muscle aches  Objective  Blood pressure 110/72, pulse (!) 58, height 5\' 1"  (1.549 m), weight 128 lb (58.1 kg), SpO2 99%.   General: No apparent distress alert and oriented x3 mood and affect normal, dressed appropriately.  HEENT: Pupils equal, extraocular movements intact  Respiratory: Patient's speak in full sentences and does not appear short of breath  Cardiovascular: No lower extremity edema, non tender, no erythema  Low back exam does have some loss of lordosis, tightness with FABER, Negative SLT   Osteopathic findings  C2 flexed rotated and side  bent right C7 flexed rotated and side bent left T5 extended rotated and side bent right inhaled rib T7 extended rotated and side bent left L1 flexed rotated and side bent right Sacrum right on right     Assessment and Plan:  Low back pain Low back does have some loss of lordosis noted.  Some tenderness overall but overall can do well with conservative therapy discussed home exercises and icing regimen, increase activity slowly.  Follow-up with me again in 6 to 8 weeks otherwise.    Nonallopathic problems  Decision today to treat with OMT was based on Physical Exam  After verbal consent patient was treated with HVLA, ME, FPR techniques in cervical, rib, thoracic, lumbar, and sacral  areas  Patient tolerated the procedure well with improvement in symptoms  Patient given exercises, stretches and lifestyle modifications  See medications in patient instructions if given  Patient will follow up in 4-8 weeks    The above documentation has been reviewed and is accurate and complete Judi Saa, DO          Note: This dictation was prepared with Dragon dictation along with smaller phrase technology. Any transcriptional errors that result from this process are unintentional.

## 2022-12-12 ENCOUNTER — Ambulatory Visit (INDEPENDENT_AMBULATORY_CARE_PROVIDER_SITE_OTHER): Payer: 59 | Admitting: Family Medicine

## 2022-12-12 ENCOUNTER — Encounter: Payer: Self-pay | Admitting: Family Medicine

## 2022-12-12 VITALS — BP 110/72 | HR 58 | Ht 61.0 in | Wt 128.0 lb

## 2022-12-12 DIAGNOSIS — M9901 Segmental and somatic dysfunction of cervical region: Secondary | ICD-10-CM | POA: Diagnosis not present

## 2022-12-12 DIAGNOSIS — M545 Low back pain, unspecified: Secondary | ICD-10-CM

## 2022-12-12 DIAGNOSIS — M9902 Segmental and somatic dysfunction of thoracic region: Secondary | ICD-10-CM | POA: Diagnosis not present

## 2022-12-12 DIAGNOSIS — M9908 Segmental and somatic dysfunction of rib cage: Secondary | ICD-10-CM | POA: Diagnosis not present

## 2022-12-12 DIAGNOSIS — M9903 Segmental and somatic dysfunction of lumbar region: Secondary | ICD-10-CM | POA: Diagnosis not present

## 2022-12-12 DIAGNOSIS — M9904 Segmental and somatic dysfunction of sacral region: Secondary | ICD-10-CM | POA: Diagnosis not present

## 2022-12-12 NOTE — Assessment & Plan Note (Signed)
Low back does have some loss of lordosis noted.  Some tenderness overall but overall can do well with conservative therapy discussed home exercises and icing regimen, increase activity slowly.  Follow-up with me again in 6 to 8 weeks otherwise.

## 2023-01-26 ENCOUNTER — Encounter: Payer: Self-pay | Admitting: Family Medicine

## 2023-02-01 NOTE — Progress Notes (Unsigned)
[name] [address] [place] [address] Phone: [phone] Subjective:   [name], am serving as a scribe for Dr. Antoine Primas.  I'm seeing this patient by the request  of:  [name], [name], MD  CC: Back and neck pain.  ION:[id number]  Naje Pulkrabek is a 52 y.o. female coming in with complaint of back and neck pain. OMT 12/12/2022. Patient states that she had increase in back pain since last visit. Over past week she has had a back spasm that won't let go. Pain middle of spine in lower back.   Medications patient has been prescribed: None  Taking:         Reviewed prior external information including notes and imaging from previsou exam, outside providers and external EMR if available.   As well as notes that were available from care everywhere and other healthcare systems.  Past medical history, social, surgical and family history all reviewed in electronic medical record.  No pertanent information unless stated regarding to the chief complaint.   Past Medical History:  Diagnosis Date   GERD (gastroesophageal reflux disease)    PUD (peptic ulcer disease)     No Known Allergies   Review of Systems:  No headache, visual changes, nausea, vomiting, diarrhea, constipation, dizziness, abdominal pain, skin rash, fevers, chills, night sweats, weight loss, swollen lymph nodes, body aches, joint swelling, chest pain, shortness of breath, mood changes. POSITIVE muscle aches  Objective  Blood pressure 98/76, pulse (!) 55, height 5\' 1"  (1.549 m), weight 130 lb (59 kg), SpO2 99%.   General: No apparent distress alert and oriented x3 mood and affect normal, dressed appropriately.  HEENT: Pupils equal, extraocular movements intact  Respiratory: Patient's speak in full sentences and does not appear short of breath  Cardiovascular: No lower extremity edema, non tender, no erythema  Low back exam does have some loss of lordosis noted.   Some tenderness to palpation in the paraspinal musculature.  Tightness noted with FABER test right greater than left.  Osteopathic findings  C2 flexed rotated and side bent right C7 flexed rotated and side bent left T3 extended rotated and side bent right inhaled rib T9 extended rotated and side bent left T11 extended rotated and side bent left L2 flexed rotated and side bent right Sacrum right on right    Assessment and Plan:  Low back pain Low back exam does have some loss lordosis noted.  Some tenderness to palpation already.  Discussed icing regimen and home exercises.  Mild exacerbation that I do think warrants muscle relaxers that were prescribed today.  Warned potential side effects.  Worsening pain advanced imaging may be warranted but think it is highly unlikely.  Likely more secondary to exacerbation of the hip flexors.  Follow-up again in 6 to 8 weeks    Nonallopathic problems  Decision today to treat with OMT was based on Physical Exam  After verbal consent patient was treated with HVLA, ME, FPR techniques in cervical, rib, thoracic, lumbar, and sacral  areas  Patient tolerated the procedure well with improvement in symptoms  Patient given exercises, stretches and lifestyle modifications  See medications in patient instructions if given  Patient will follow up in 4-8 weeks     The above documentation has been reviewed and is accurate and complete Judi Saa, DO         Note: This dictation was prepared with Dragon dictation along with smaller phrase technology. Any transcriptional  errors that result from this process are unintentional.

## 2023-02-02 ENCOUNTER — Encounter: Payer: Self-pay | Admitting: Family Medicine

## 2023-02-02 ENCOUNTER — Ambulatory Visit (INDEPENDENT_AMBULATORY_CARE_PROVIDER_SITE_OTHER): Payer: 59 | Admitting: Family Medicine

## 2023-02-02 VITALS — BP 98/76 | HR 55 | Ht 61.0 in | Wt 130.0 lb

## 2023-02-02 DIAGNOSIS — M9901 Segmental and somatic dysfunction of cervical region: Secondary | ICD-10-CM | POA: Diagnosis not present

## 2023-02-02 DIAGNOSIS — M9902 Segmental and somatic dysfunction of thoracic region: Secondary | ICD-10-CM | POA: Diagnosis not present

## 2023-02-02 DIAGNOSIS — M9903 Segmental and somatic dysfunction of lumbar region: Secondary | ICD-10-CM | POA: Diagnosis not present

## 2023-02-02 DIAGNOSIS — M545 Low back pain, unspecified: Secondary | ICD-10-CM | POA: Diagnosis not present

## 2023-02-02 DIAGNOSIS — M9904 Segmental and somatic dysfunction of sacral region: Secondary | ICD-10-CM | POA: Diagnosis not present

## 2023-02-02 DIAGNOSIS — M9908 Segmental and somatic dysfunction of rib cage: Secondary | ICD-10-CM | POA: Diagnosis not present

## 2023-02-02 MED ORDER — TIZANIDINE HCL 4 MG PO TABS: 4.0000 mg | ORAL_TABLET | Freq: Every day | ORAL | 0 refills | Status: DC

## 2023-02-02 NOTE — Patient Instructions (Signed)
Good to see you Hip flexor stretch Zanaflex 4mg  at night if needed See me again in 5-6 weeks

## 2023-02-02 NOTE — Assessment & Plan Note (Signed)
Low back exam does have some loss lordosis noted.  Some tenderness to palpation already.  Discussed icing regimen and home exercises.  Mild exacerbation that I do think warrants muscle relaxers that were prescribed today.  Warned potential side effects.  Worsening pain advanced imaging may be warranted but think it is highly unlikely.  Likely more secondary to exacerbation of the hip flexors.  Follow-up again in 6 to 8 weeks

## 2023-02-13 ENCOUNTER — Ambulatory Visit: Payer: 59 | Admitting: Family Medicine

## 2023-02-24 ENCOUNTER — Encounter: Payer: Self-pay | Admitting: Family Medicine

## 2023-03-07 ENCOUNTER — Other Ambulatory Visit: Payer: Self-pay | Admitting: Family Medicine

## 2023-03-14 ENCOUNTER — Encounter: Payer: Self-pay | Admitting: Family Medicine

## 2023-03-14 ENCOUNTER — Ambulatory Visit (INDEPENDENT_AMBULATORY_CARE_PROVIDER_SITE_OTHER): Payer: 59 | Admitting: Family Medicine

## 2023-03-14 VITALS — BP 100/62 | HR 81 | Ht 61.0 in | Wt 125.0 lb

## 2023-03-14 DIAGNOSIS — M9901 Segmental and somatic dysfunction of cervical region: Secondary | ICD-10-CM | POA: Diagnosis not present

## 2023-03-14 DIAGNOSIS — M9904 Segmental and somatic dysfunction of sacral region: Secondary | ICD-10-CM

## 2023-03-14 DIAGNOSIS — M9903 Segmental and somatic dysfunction of lumbar region: Secondary | ICD-10-CM | POA: Diagnosis not present

## 2023-03-14 DIAGNOSIS — M79669 Pain in unspecified lower leg: Secondary | ICD-10-CM | POA: Diagnosis not present

## 2023-03-14 DIAGNOSIS — M9908 Segmental and somatic dysfunction of rib cage: Secondary | ICD-10-CM

## 2023-03-14 DIAGNOSIS — M9902 Segmental and somatic dysfunction of thoracic region: Secondary | ICD-10-CM

## 2023-03-14 DIAGNOSIS — M2550 Pain in unspecified joint: Secondary | ICD-10-CM | POA: Diagnosis not present

## 2023-03-14 DIAGNOSIS — M545 Low back pain, unspecified: Secondary | ICD-10-CM | POA: Diagnosis not present

## 2023-03-14 DIAGNOSIS — Z86718 Personal history of other venous thrombosis and embolism: Secondary | ICD-10-CM

## 2023-03-14 LAB — CBC WITH DIFFERENTIAL/PLATELET
Basophils Absolute: 0 10*3/uL (ref 0.0–0.1)
Basophils Relative: 0.3 % (ref 0.0–3.0)
Eosinophils Absolute: 0.2 10*3/uL (ref 0.0–0.7)
Eosinophils Relative: 4.1 % (ref 0.0–5.0)
HCT: 37.2 % (ref 36.0–46.0)
Hemoglobin: 12.4 g/dL (ref 12.0–15.0)
Lymphocytes Relative: 22.6 % (ref 12.0–46.0)
Lymphs Abs: 1.3 10*3/uL (ref 0.7–4.0)
MCHC: 33.4 g/dL (ref 30.0–36.0)
MCV: 87.1 fL (ref 78.0–100.0)
Monocytes Absolute: 0.4 10*3/uL (ref 0.1–1.0)
Monocytes Relative: 6.8 % (ref 3.0–12.0)
Neutro Abs: 3.9 10*3/uL (ref 1.4–7.7)
Neutrophils Relative %: 66.2 % (ref 43.0–77.0)
Platelets: 279 10*3/uL (ref 150.0–400.0)
RBC: 4.27 Mil/uL (ref 3.87–5.11)
RDW: 13.1 % (ref 11.5–15.5)
WBC: 6 10*3/uL (ref 4.0–10.5)

## 2023-03-14 LAB — COMPREHENSIVE METABOLIC PANEL
ALT: 14 U/L (ref 0–35)
AST: 15 U/L (ref 0–37)
Albumin: 4.6 g/dL (ref 3.5–5.2)
Alkaline Phosphatase: 54 U/L (ref 39–117)
BUN: 18 mg/dL (ref 6–23)
CO2: 30 meq/L (ref 19–32)
Calcium: 9.4 mg/dL (ref 8.4–10.5)
Chloride: 104 meq/L (ref 96–112)
Creatinine, Ser: 0.79 mg/dL (ref 0.40–1.20)
GFR: 86.91 mL/min (ref >60.00)
Glucose, Bld: 103 mg/dL — ABNORMAL HIGH (ref 70–99)
Potassium: 4.6 meq/L (ref 3.5–5.1)
Sodium: 141 meq/L (ref 135–145)
Total Bilirubin: 0.4 mg/dL (ref 0.2–1.2)
Total Protein: 7.1 g/dL (ref 6.0–8.3)

## 2023-03-14 LAB — IBC PANEL
Iron: 73 ug/dL (ref 42–145)
Saturation Ratios: 21.8 % (ref 20.0–50.0)
TIBC: 334.6 ug/dL (ref 250.0–450.0)
Transferrin: 239 mg/dL (ref 212.0–360.0)

## 2023-03-14 LAB — URIC ACID: Uric Acid, Serum: 5 mg/dL (ref 2.4–7.0)

## 2023-03-14 LAB — VITAMIN D 25 HYDROXY (VIT D DEFICIENCY, FRACTURES): VITD: 40.59 ng/mL (ref 30.00–100.00)

## 2023-03-14 LAB — SEDIMENTATION RATE: Sed Rate: 5 mm/h (ref 0–30)

## 2023-03-14 LAB — FERRITIN: Ferritin: 69.2 ng/mL (ref 10.0–291.0)

## 2023-03-14 LAB — TSH: TSH: 1.34 u[IU]/mL (ref 0.35–5.50)

## 2023-03-14 LAB — VITAMIN B12: Vitamin B-12: 1228 pg/mL — ABNORMAL HIGH (ref 211–911)

## 2023-03-14 MED ORDER — MELOXICAM 15 MG PO TABS: 15.0000 mg | ORAL_TABLET | Freq: Every day | ORAL | 0 refills | Status: AC

## 2023-03-14 NOTE — Assessment & Plan Note (Signed)
Checking labs

## 2023-03-14 NOTE — Progress Notes (Signed)
[name] [address] [place] [address] Phone: [phone] Subjective:   [name], am serving as a scribe for Dr. Antoine Primas.  I'm seeing this patient by the request  of:  [name], [name], MD  CC: Back pain follow-up  [id number]  Samantha Solomon is a 52 y.o. female coming in with complaint of back and neck pain Patient states that her lower back and L hip pain have increased. Pain over the ASIS. Painful sit to stand and with running. Had injection from another provider years ago.   Patient notes a constant tinglng in L foot and into the medial calf.    Medications patient has been prescribed: Zanaflex  Taking: Intermittently yes         Reviewed prior external information including notes and imaging from previsou exam, outside providers and external EMR if available.   As well as notes that were available from care everywhere and other healthcare systems.  Past medical history, social, surgical and family history all reviewed in electronic medical record.  No pertanent information unless stated regarding to the chief complaint.   Past Medical History:  Diagnosis Date   GERD (gastroesophageal reflux disease)    PUD (peptic ulcer disease)     No Known Allergies   Review of Systems:  No headache, visual changes, nausea, vomiting, diarrhea, constipation, dizziness, abdominal pain, skin rash, fevers, chills, night sweats, weight loss, swollen lymph nodes, body aches, joint swelling, chest pain, shortness of breath, mood changes. POSITIVE muscle aches  Objective  Blood pressure 100/62, pulse 81, height 5\' 1"  (1.549 m), weight 125 lb (56.7 kg), SpO2 98%.   General: No apparent distress alert and oriented x3 mood and affect normal, dressed appropriately.  HEENT: Pupils equal, extraocular movements intact  Respiratory: Patient's speak in full sentences and does not appear short of breath  Cardiovascular: No lower  extremity edema, non tender, no erythema  Left hip exam shows that there is tenderness over the tensor fascia lata.  Negative straight leg test.  Patient does have some irregular tenderness down the left leg.  Patient does have a weird sensation in the left leg with even light palpation in the area.  Osteopathic findings  C2 flexed rotated and side bent right C6 flexed rotated and side bent left T3 extended rotated and side bent right inhaled rib T9 extended rotated and side bent left L2 flexed rotated and side bent right Sacrum right on right       Assessment and Plan:  Low back pain Concerned that some of the radicular symptoms could be secondary to the back.  More concerned the patient's history of the fracture of the leg, copperhead bite, as well as DVT that we do need to consider the possibility of vascular compromise.  ABI ordered and will get laboratory workup.  Increase activity slowly otherwise.  Follow-up with me again in 6 to 8 weeks.  Patient knows worsening pain to seek medical attention immediately.  Meloxicam given for breakthrough pain.    Nonallopathic problems  Decision today to treat with OMT was based on Physical Exam  After verbal consent patient was treated with HVLA, ME, FPR techniques in cervical, rib, thoracic, lumbar, and sacral  areas  Patient tolerated the procedure well with improvement in symptoms  Patient given exercises, stretches and lifestyle modifications  See medications in patient instructions if given  Patient will follow up in 4-8 weeks    The above documentation has been  reviewed and is accurate and complete Judi Saa, DO          Note: This dictation was prepared with Dragon dictation along with smaller phrase technology. Any transcriptional errors that result from this process are unintentional.

## 2023-03-14 NOTE — Patient Instructions (Signed)
Labs today [facility]  ([facility] in [facility]) [address] [place], [place] [address] [phone] See me again in 6 weeks

## 2023-03-14 NOTE — Assessment & Plan Note (Signed)
Concerned that some of the radicular symptoms could be secondary to the back.  More concerned the patient's history of the fracture of the leg, copperhead bite, as well as DVT that we do need to consider the possibility of vascular compromise.  ABI ordered and will get laboratory workup.  Increase activity slowly otherwise.  Follow-up with me again in 6 to 8 weeks.  Patient knows worsening pain to seek medical attention immediately.  Meloxicam given for breakthrough pain.

## 2023-03-16 LAB — PTH, INTACT AND CALCIUM
Calcium: 9.5 mg/dL (ref 8.6–10.4)
PTH: 47 pg/mL (ref 16–77)

## 2023-03-17 ENCOUNTER — Ambulatory Visit: Payer: 59 | Admitting: Family Medicine

## 2023-03-28 ENCOUNTER — Encounter (HOSPITAL_COMMUNITY): Payer: 59

## 2023-04-06 ENCOUNTER — Ambulatory Visit (HOSPITAL_COMMUNITY)
Admission: RE | Admit: 2023-04-06 | Discharge: 2023-04-06 | Disposition: A | Discharge status: Home / Self Care | Payer: No Typology Code available for payment source | Source: Ambulatory Visit | Attending: Internal Medicine | Admitting: Internal Medicine

## 2023-04-06 DIAGNOSIS — M79669 Pain in unspecified lower leg: Secondary | ICD-10-CM | POA: Diagnosis present

## 2023-04-06 DIAGNOSIS — M79662 Pain in left lower leg: Secondary | ICD-10-CM | POA: Insufficient documentation

## 2023-04-06 LAB — VAS US LOWER EXT ART SEG MULTI (SEGMENTALS & LE RAYNAUDS)
Left ABI: 1.21
Right ABI: 1.24

## 2023-04-10 ENCOUNTER — Encounter: Payer: Self-pay | Admitting: Family Medicine

## 2023-04-28 ENCOUNTER — Ambulatory Visit: Payer: BC Managed Care – PPO | Admitting: Family Medicine

## 2023-04-28 ENCOUNTER — Encounter: Payer: Self-pay | Admitting: Family Medicine

## 2023-04-28 VITALS — BP 100/68 | HR 81 | Ht 61.0 in | Wt 125.0 lb

## 2023-04-28 DIAGNOSIS — M9904 Segmental and somatic dysfunction of sacral region: Secondary | ICD-10-CM

## 2023-04-28 DIAGNOSIS — M545 Low back pain, unspecified: Secondary | ICD-10-CM | POA: Diagnosis not present

## 2023-04-28 DIAGNOSIS — M9901 Segmental and somatic dysfunction of cervical region: Secondary | ICD-10-CM | POA: Diagnosis not present

## 2023-04-28 DIAGNOSIS — M9903 Segmental and somatic dysfunction of lumbar region: Secondary | ICD-10-CM | POA: Diagnosis not present

## 2023-04-28 DIAGNOSIS — M9902 Segmental and somatic dysfunction of thoracic region: Secondary | ICD-10-CM | POA: Diagnosis not present

## 2023-04-28 DIAGNOSIS — M9908 Segmental and somatic dysfunction of rib cage: Secondary | ICD-10-CM | POA: Diagnosis not present

## 2023-04-28 NOTE — Assessment & Plan Note (Signed)
Continues to have low back pain with some mild radiation into the gluteal area.  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  I am more concerned that this is the leg that patient did have the tibial plateau fracture as well as had a copperhead snake bite.  I do believe that there is likely a nerve injury as well.  Discussed with patient about icing regimen and home exercises.  Discussed with patient that depending on the MRI that she is getting from the outside facility to see if patient is a candidate for possible epidurals or nerve root injections.  Patient can call if she has any other difficulties.  Follow-up with me again in 6 to 8 weeks.  Did respond well to osteopathic manipulation today.

## 2023-04-28 NOTE — Progress Notes (Signed)
[name] [address] [place] [address] Phone: [phone] Subjective:   Samantha Solomon, am serving as a scribe for Dr. Antoine Primas.  I'm seeing this patient by the request  of:  [name], [name], MD  CC: Back and neck pain follow-up  BJY:[id number]  Samantha Solomon is a 53 y.o. female coming in with complaint of back and neck pain. OMT 03/14/2023. Patient states that her back is ok. L hip pain near iliac crest. Had injection in hip many years ago that seemed to work but is worsening recently. The more she moves the better she feels.   Had EMG recently. Near L2 and L4. Also having MRI today for lumbar spine.   Medications patient has been prescribed: Meloxicam  Taking:         Reviewed prior external information including notes and imaging from previsou exam, outside providers and external EMR if available.   As well as notes that were available from care everywhere and other healthcare systems.  Past medical history, social, surgical and family history all reviewed in electronic medical record.  No pertanent information unless stated regarding to the chief complaint.   Past Medical History:  Diagnosis Date   GERD (gastroesophageal reflux disease)    PUD (peptic ulcer disease)     No Known Allergies   Review of Systems:  No headache, visual changes, nausea, vomiting, diarrhea, constipation, dizziness, abdominal pain, skin rash, fevers, chills, night sweats, weight loss, swollen lymph nodes, body aches, joint swelling, chest pain, shortness of breath, mood changes. POSITIVE muscle aches  Objective  Blood pressure 100/68, pulse 81, height 5\' 1"  (1.549 m), weight 125 lb (56.7 kg), SpO2 97%.   General: No apparent distress alert and oriented x3 mood and affect normal, dressed appropriately.  HEENT: Pupils equal, extraocular movements intact  Respiratory: Patient's speak in full sentences and does not appear short of breath   Cardiovascular: No lower extremity edema, non tender, no erythema  Gait MSK:  Back does have some loss lordosis.  Tightness noted with FABER on the left side.  Negative straight leg test but patient does have some numbness on the bottom of the foot.  Osteopathic findings  C2 flexed rotated and side bent right C6 flexed rotated and side bent left T3 extended rotated and side bent right inhaled rib T9 extended rotated and side bent left L2 flexed rotated and side bent right L5 flexed rotated and side bent left Sacrum right on right       Assessment and Plan:  Low back pain Continues to have low back pain with some mild radiation into the gluteal area.  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  I am more concerned that this is the leg that patient did have the tibial plateau fracture as well as had a copperhead snake bite.  I do believe that there is likely a nerve injury as well.  Discussed with patient about icing regimen and home exercises.  Discussed with patient that depending on the MRI that she is getting from the outside facility to see if patient is a candidate for possible epidurals or nerve root injections.  Patient can call if she has any other difficulties.  Follow-up with me again in 6 to 8 weeks.  Did respond well to osteopathic manipulation today.    Nonallopathic problems  Decision today to  treat with OMT was based on Physical Exam  After verbal consent patient was treated with HVLA, ME, FPR techniques in cervical, rib, thoracic, lumbar, and sacral  areas  Patient tolerated the procedure well with improvement in symptoms  Patient given exercises, stretches and lifestyle modifications  See medications in patient instructions if given  Patient will follow up in 4-8 weeks     The above documentation has been reviewed and is accurate and complete Judi Saa, DO         Note: This dictation was prepared with Dragon dictation along with smaller phrase technology. Any transcriptional errors that result from this process are unintentional.

## 2023-04-29 IMAGING — DX DG FOOT COMPLETE 3+V*R*
3 series · 3 of 3 positions shown · non-contrast
Comparison: None Available.

CLINICAL DATA: Foot pain. Pain to the right fifth digit.

EXAM:
RIGHT FOOT COMPLETE - 3+ VIEW

[foot ap]
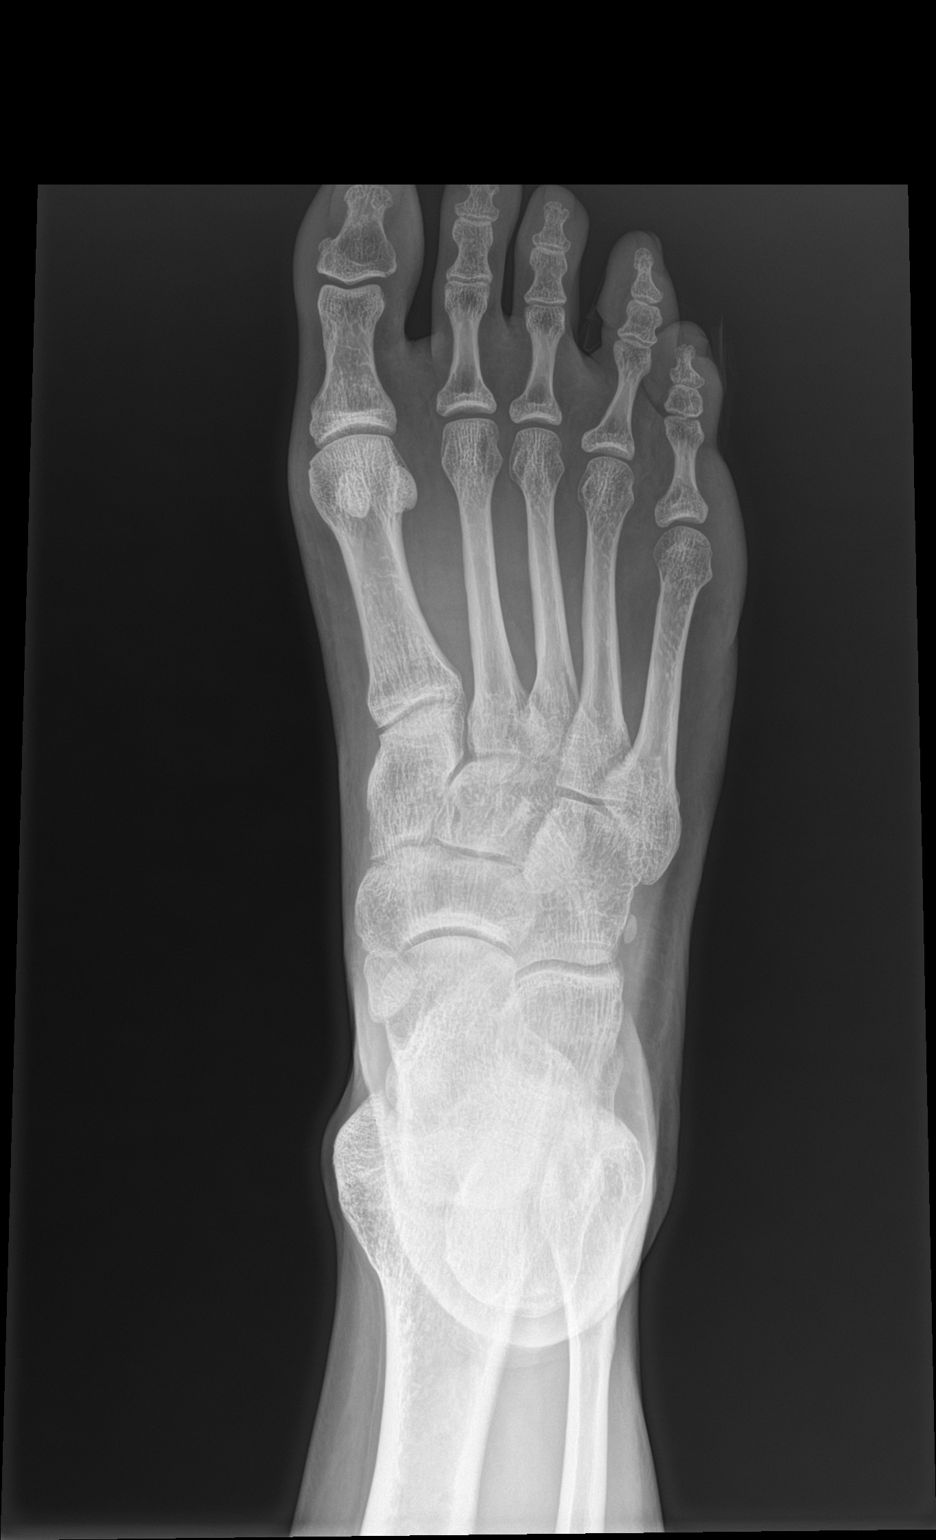

[foot obl]
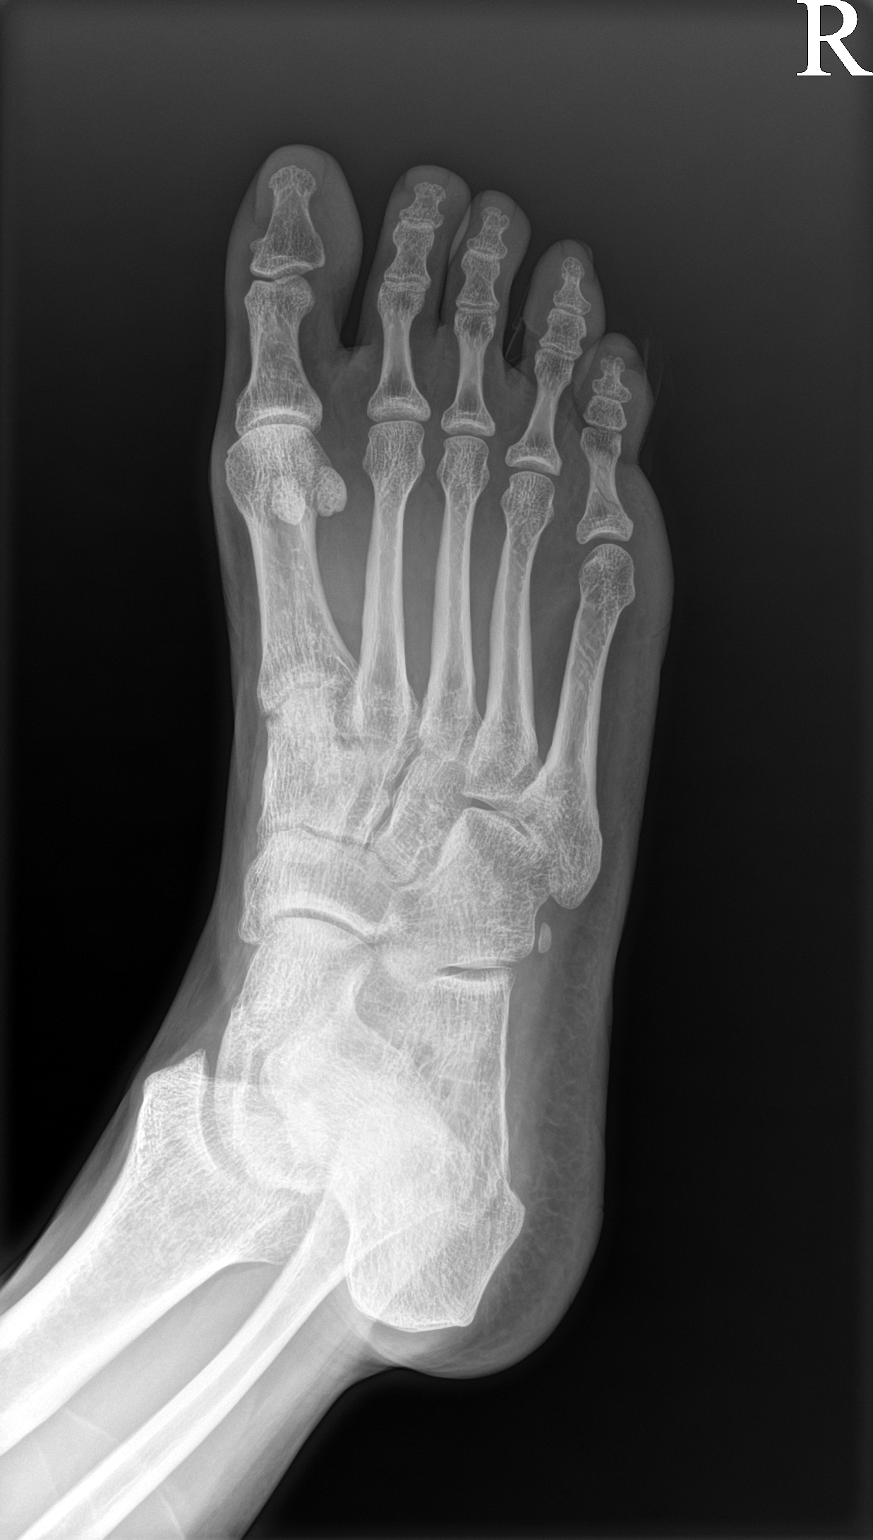

[foot lat]
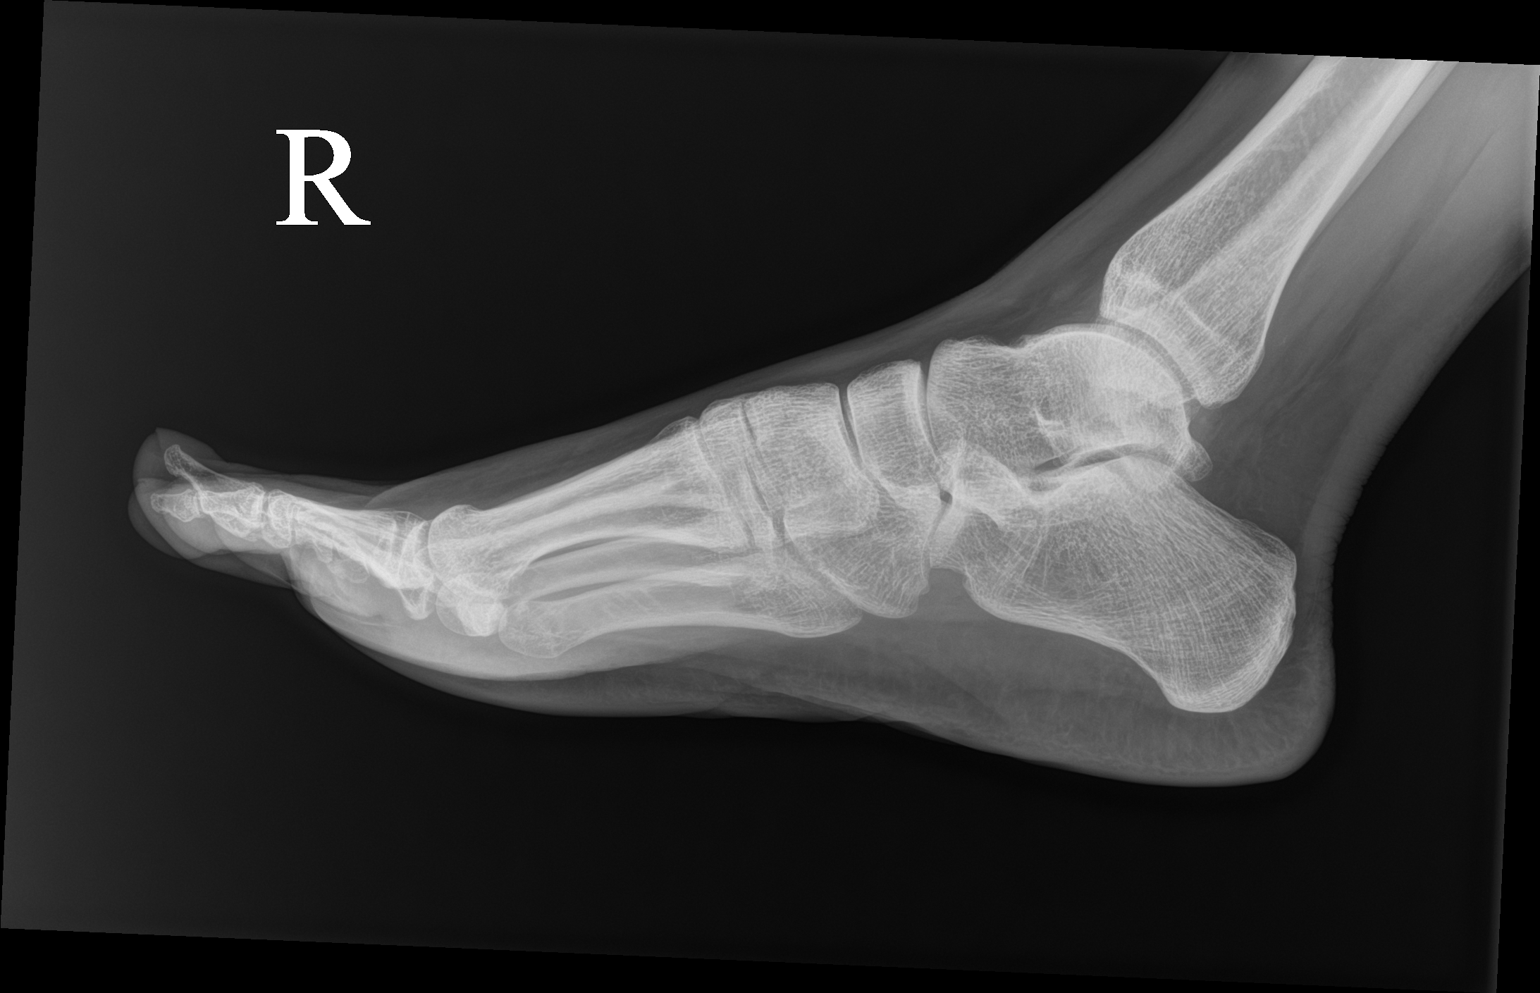

[3 of 3 positions shown; findings below may reference images not displayed]

FINDINGS: An oblique nondisplaced fracture is present in the proximal phalanx
of the fifth digit. Soft tissue swelling is associated. No
additional fractures are present.
IMPRESSION: Oblique nondisplaced fracture in the proximal phalanx of the fifth
digit.

## 2023-05-04 ENCOUNTER — Other Ambulatory Visit: Payer: Self-pay

## 2023-05-04 ENCOUNTER — Encounter: Payer: Self-pay | Admitting: Family Medicine

## 2023-05-04 DIAGNOSIS — M5416 Radiculopathy, lumbar region: Secondary | ICD-10-CM

## 2023-05-19 ENCOUNTER — Encounter: Payer: Self-pay | Admitting: Family Medicine

## 2023-05-22 NOTE — Discharge Instructions (Signed)

## 2023-05-23 ENCOUNTER — Ambulatory Visit
Admission: RE | Admit: 2023-05-23 | Discharge: 2023-05-23 | Disposition: A | Discharge status: Home / Self Care | Payer: BC Managed Care – PPO | Source: Ambulatory Visit | Attending: Family Medicine | Admitting: Family Medicine

## 2023-05-23 DIAGNOSIS — M5416 Radiculopathy, lumbar region: Secondary | ICD-10-CM

## 2023-05-23 MED ORDER — IOPAMIDOL (ISOVUE-M 200) INJECTION 41%
1.0000 mL | Freq: Once | INTRAMUSCULAR | Status: AC
  Administered 2023-05-23: 1 mL via EPIDURAL

## 2023-05-23 MED ORDER — METHYLPREDNISOLONE ACETATE 40 MG/ML INJ SUSP (RADIOLOG
80.0000 mg | Freq: Once | INTRAMUSCULAR | Status: AC
  Administered 2023-05-23: 80 mg via EPIDURAL

## 2023-06-07 NOTE — Progress Notes (Deleted)
  [facility] [address] [place] [address] Phone: [phone] Subjective:    I'm seeing this patient by the request  of:  [name], [name], MD  CC:   [id number]  Jacobi [name] is a 53 y.o. female coming in with complaint of back and neck pain. OMT 04/28/2023. Patient states   Medications patient has been prescribed: Meloxicam, Zanaflex  Taking:         Reviewed prior external information including notes and imaging from previsou exam, outside providers and external EMR if available.   As well as notes that were available from care everywhere and other healthcare systems.  Past medical history, social, surgical and family history all reviewed in electronic medical record.  No pertanent information unless stated regarding to the chief complaint.   Past Medical History:  Diagnosis Date   GERD (gastroesophageal reflux disease)    PUD (peptic ulcer disease)     No Known Allergies   Review of Systems:  No headache, visual changes, nausea, vomiting, diarrhea, constipation, dizziness, abdominal pain, skin rash, fevers, chills, night sweats, weight loss, swollen lymph nodes, body aches, joint swelling, chest pain, shortness of breath, mood changes. POSITIVE muscle aches  Objective  There were no vitals taken for this visit.   General: No apparent distress alert and oriented x3 mood and affect normal, dressed appropriately.  HEENT: Pupils equal, extraocular movements intact  Respiratory: Patient's speak in full sentences and does not appear short of breath  Cardiovascular: No lower extremity edema, non tender, no erythema  Gait MSK:  Back   Osteopathic findings  C2 flexed rotated and side bent right C6 flexed rotated and side bent left T3 extended rotated and side bent right inhaled rib T9 extended rotated and side bent left L2 flexed rotated and side bent right Sacrum right on right       Assessment and Plan:  No  problem-specific Assessment & Plan notes found for this encounter.    Nonallopathic problems  Decision today to treat with OMT was based on Physical Exam  After verbal consent patient was treated with HVLA, ME, FPR techniques in cervical, rib, thoracic, lumbar, and sacral  areas  Patient tolerated the procedure well with improvement in symptoms  Patient given exercises, stretches and lifestyle modifications  See medications in patient instructions if given  Patient will follow up in 4-8 weeks             Note: This dictation was prepared with Dragon dictation along with smaller phrase technology. Any transcriptional errors that result from this process are unintentional.

## 2023-06-09 ENCOUNTER — Ambulatory Visit: Payer: BC Managed Care – PPO | Admitting: Family Medicine

## 2023-06-13 ENCOUNTER — Ambulatory Visit: Admitting: Family Medicine

## 2023-07-26 NOTE — Progress Notes (Deleted)
  [name] [address] [place] [address] Phone: [phone] Subjective:    I'm seeing this patient by the request  of:  [name], [name], MD  CC:   BJY:[id number]  Samantha Solomon is a 53 y.o. female coming in with complaint of back and neck pain. OMT on 04/28/2023. Patient states   Medications patient has been prescribed:   Taking:         Reviewed prior external information including notes and imaging from previsou exam, outside providers and external EMR if available.   As well as notes that were available from care everywhere and other healthcare systems.  Past medical history, social, surgical and family history all reviewed in electronic medical record.  No pertanent information unless stated regarding to the chief complaint.   Past Medical History:  Diagnosis Date   GERD (gastroesophageal reflux disease)    PUD (peptic ulcer disease)     No Known Allergies   Review of Systems:  No headache, visual changes, nausea, vomiting, diarrhea, constipation, dizziness, abdominal pain, skin rash, fevers, chills, night sweats, weight loss, swollen lymph nodes, body aches, joint swelling, chest pain, shortness of breath, mood changes. POSITIVE muscle aches  Objective  There were no vitals taken for this visit.   General: No apparent distress alert and oriented x3 mood and affect normal, dressed appropriately.  HEENT: Pupils equal, extraocular movements intact  Respiratory: Patient's speak in full sentences and does not appear short of breath  Cardiovascular: No lower extremity edema, non tender, no erythema  Gait MSK:  Back   Osteopathic findings  C2 flexed rotated and side bent right C6 flexed rotated and side bent left T3 extended rotated and side bent right inhaled rib T9 extended rotated and side bent left L2 flexed rotated and side bent right Sacrum right on right       Assessment and Plan:  No problem-specific  Assessment & Plan notes found for this encounter.    Nonallopathic problems  Decision today to treat with OMT was based on Physical Exam  After verbal consent patient was treated with HVLA, ME, FPR techniques in cervical, rib, thoracic, lumbar, and sacral  areas  Patient tolerated the procedure well with improvement in symptoms  Patient given exercises, stretches and lifestyle modifications  See medications in patient instructions if given  Patient will follow up in 4-8 weeks             Note: This dictation was prepared with Dragon dictation along with smaller phrase technology. Any transcriptional errors that result from this process are unintentional.

## 2023-07-27 ENCOUNTER — Ambulatory Visit: Admitting: Family Medicine

## 2023-08-01 ENCOUNTER — Ambulatory Visit: Admitting: Family Medicine

## 2023-08-04 NOTE — Progress Notes (Unsigned)
  [name] [address] [place] [address] Phone: [phone] Subjective:   IBryan Caprio, am serving as a scribe for Dr. Ronnell Coins.  I'Solomon seeing this patient by the request  of:  [name], [name], MD  CC: back and neck pain follow up   [id number]  Samantha Solomon is a 53 y.o. female coming in with complaint of back and neck pain. Omt 04/28/2023. Epidural 05/23/2023. Patient states epidural helped. May want another one.  Medications patient has been prescribed: Meloxicam , Zanaflex   Taking: Yes         Reviewed prior external information including notes and imaging from previsou exam, outside providers and external EMR if available.   As well as notes that were available from care everywhere and other healthcare systems.  Past medical history, social, surgical and family history all reviewed in electronic medical record.  No pertanent information unless stated regarding to the chief complaint.   Past Medical History:  Diagnosis Date   GERD (gastroesophageal reflux disease)    PUD (peptic ulcer disease)     No Known Allergies   Review of Systems:  No headache, visual changes, nausea, vomiting, diarrhea, constipation, dizziness, abdominal pain, skin rash, fevers, chills, night sweats, weight loss, swollen lymph nodes, body aches, joint swelling, chest pain, shortness of breath, mood changes. POSITIVE muscle aches  Objective  Blood pressure 110/70, pulse 80, height 5\' 1"  (1.549 Solomon), weight 126 lb (57.2 kg), SpO2 98%.   General: No apparent distress alert and oriented x3 mood and affect normal, dressed appropriately.  HEENT: Pupils equal, extraocular movements intact  Respiratory: Patient's speak in full sentences and does not appear short of breath  Cardiovascular: No lower extremity edema, non tender, no erythema  Gait MSK:  Back does have some loss lordosis.  Patient does have significant tightness noted on the paraspinal  musculature of the lumbar spine.  Positive straight leg test on the right side in the L4-L5 distribution.  No significant weakness noted.  Osteopathic findings  C2 flexed rotated and side bent right C6 flexed rotated and side bent left T3 extended rotated and side bent right inhaled rib T9 extended rotated and side bent left L2 flexed rotated and side bent right L4 flexed rotated and side bent right Sacrum right on right     Assessment and Plan:  Low back pain Continued radicular symptoms noted.  Discussed icing regimen and home exercises, trying to stay active or possible.  Did get some improvement with the radicular symptoms as well.  Discussed icing regimen and home exercises.  Discussed which activities to do and which ones to avoid.  Increase activity slowly.  Follow-up again 6 to 8 weeks otherwise.    Nonallopathic problems  Decision today to treat with OMT was based on Physical Exam  After verbal consent patient was treated with HVLA, ME, FPR techniques in cervical, rib, thoracic, lumbar, and sacral  areas  Patient tolerated the procedure well with improvement in symptoms  Patient given exercises, stretches and lifestyle modifications  See medications in patient instructions if given  Patient will follow up in 4-8 weeks    The above documentation has been reviewed and is accurate and complete Samantha Solomon Samantha Oviatt, DO          Note: This dictation was prepared with Dragon dictation along with smaller phrase technology. Any transcriptional errors that result from this process are unintentional.

## 2023-08-07 ENCOUNTER — Encounter: Payer: Self-pay | Admitting: Family Medicine

## 2023-08-07 ENCOUNTER — Ambulatory Visit: Admitting: Family Medicine

## 2023-08-07 VITALS — BP 110/70 | HR 80 | Ht 61.0 in | Wt 126.0 lb

## 2023-08-07 DIAGNOSIS — M545 Low back pain, unspecified: Secondary | ICD-10-CM | POA: Diagnosis not present

## 2023-08-07 DIAGNOSIS — M9908 Segmental and somatic dysfunction of rib cage: Secondary | ICD-10-CM

## 2023-08-07 DIAGNOSIS — M9901 Segmental and somatic dysfunction of cervical region: Secondary | ICD-10-CM | POA: Diagnosis not present

## 2023-08-07 DIAGNOSIS — M9903 Segmental and somatic dysfunction of lumbar region: Secondary | ICD-10-CM

## 2023-08-07 DIAGNOSIS — M9902 Segmental and somatic dysfunction of thoracic region: Secondary | ICD-10-CM

## 2023-08-07 DIAGNOSIS — M9904 Segmental and somatic dysfunction of sacral region: Secondary | ICD-10-CM | POA: Diagnosis not present

## 2023-08-07 NOTE — Patient Instructions (Signed)
 Good to see you! [facility] [id number] [facility] [employer] See you again in 7-8 weeks

## 2023-08-07 NOTE — Assessment & Plan Note (Signed)
 Continued radicular symptoms noted.  Discussed icing regimen and home exercises, trying to stay active or possible.  Did get some improvement with the radicular symptoms as well.  Discussed icing regimen and home exercises.  Discussed which activities to do and which ones to avoid.  Increase activity slowly.  Follow-up again 6 to 8 weeks otherwise.

## 2023-08-21 ENCOUNTER — Encounter: Payer: Self-pay | Admitting: Family Medicine

## 2023-08-25 NOTE — Discharge Instructions (Signed)

## 2023-08-29 ENCOUNTER — Ambulatory Visit
Admission: RE | Admit: 2023-08-29 | Discharge: 2023-08-29 | Disposition: A | Discharge status: Home / Self Care | Source: Ambulatory Visit | Attending: Family Medicine | Admitting: Family Medicine

## 2023-08-29 DIAGNOSIS — M545 Low back pain, unspecified: Secondary | ICD-10-CM

## 2023-08-29 MED ORDER — METHYLPREDNISOLONE ACETATE 40 MG/ML INJ SUSP (RADIOLOG
80.0000 mg | Freq: Once | INTRAMUSCULAR | Status: AC
  Administered 2023-08-29: 80 mg via EPIDURAL

## 2023-08-29 MED ORDER — IOPAMIDOL (ISOVUE-M 200) INJECTION 41%
1.0000 mL | Freq: Once | INTRAMUSCULAR | Status: AC
  Administered 2023-08-29: 1 mL via EPIDURAL

## 2023-09-18 NOTE — Progress Notes (Signed)
  [name] [address] [place] [address] Phone: [phone] Subjective:   IBryan Solomon, am serving as a scribe for Dr. Ronnell Coins.  I'm seeing this patient by the request  of:  [name], [name], MD  CC: Back and neck pain follow-up  BJY:[id number]  Sequoia [name] is a 53 y.o. female coming in with complaint of back and neck pain. OMT 08/07/2023. Patient states same per usual.  Epidurals are working great. No new symptoms.  Medications patient has been prescribed: None  Taking:         Reviewed prior external information including notes and imaging from previsou exam, outside providers and external EMR if available.   As well as notes that were available from care everywhere and other healthcare systems.  Past medical history, social, surgical and family history all reviewed in electronic medical record.  No pertanent information unless stated regarding to the chief complaint.   Past Medical History:  Diagnosis Date   GERD (gastroesophageal reflux disease)    PUD (peptic ulcer disease)     No Known Allergies   Review of Systems:  No headache, visual changes, nausea, vomiting, diarrhea, constipation, dizziness, abdominal pain, skin rash, fevers, chills, night sweats, weight loss, swollen lymph nodes, body aches, joint swelling, chest pain, shortness of breath, mood changes. POSITIVE muscle aches  Objective  Blood pressure 104/66, pulse 83, height 5' 1 (1.549 m), weight 130 lb (59 kg), SpO2 98%.   General: No apparent distress alert and oriented x3 mood and affect normal, dressed appropriately.  HEENT: Pupils equal, extraocular movements intact  Respiratory: Patient's speak in full sentences and does not appear short of breath  Cardiovascular: No lower extremity edema, non tender, no erythema  Gait MSK:  Back does have some loss lordosis noted.  Some tenderness to palpation in the paraspinal musculature.  Tightness in the  thoracolumbar juncture noted as well  Osteopathic findings  T3 extended rotated and side bent right inhaled rib T11 extended rotated and side bent left L1 flexed rotated and side bent right L3 flexed rotated and side bent left Sacrum right on right       Assessment and Plan:  Low back pain Significant improvement after the epidural.  Good range of motion noted and did respond extremely well to osteopathic manipulation today.  Discussed icing regimen and home exercises, increase activity slowly.  No change in medications at the moment.  Follow-up with me again in 6 to 8 weeks.    Nonallopathic problems  Decision today to treat with OMT was based on Physical Exam  After verbal consent patient was treated with HVLA, ME, FPR techniques in  rib, thoracic, lumbar, and sacral  areas  Patient tolerated the procedure well with improvement in symptoms  Patient given exercises, stretches and lifestyle modifications  See medications in patient instructions if given  Patient will follow up in 4-8 weeks    The above documentation has been reviewed and is accurate and complete Seymone Forlenza M Emrys Mceachron, DO          Note: This dictation was prepared with Dragon dictation along with smaller phrase technology. Any transcriptional errors that result from this process are unintentional.

## 2023-09-19 ENCOUNTER — Encounter: Payer: Self-pay | Admitting: Family Medicine

## 2023-09-19 ENCOUNTER — Ambulatory Visit: Admitting: Family Medicine

## 2023-09-19 VITALS — BP 104/66 | HR 83 | Ht 61.0 in | Wt 130.0 lb

## 2023-09-19 DIAGNOSIS — M9904 Segmental and somatic dysfunction of sacral region: Secondary | ICD-10-CM

## 2023-09-19 DIAGNOSIS — M9908 Segmental and somatic dysfunction of rib cage: Secondary | ICD-10-CM

## 2023-09-19 DIAGNOSIS — M545 Low back pain, unspecified: Secondary | ICD-10-CM | POA: Diagnosis not present

## 2023-09-19 DIAGNOSIS — M9902 Segmental and somatic dysfunction of thoracic region: Secondary | ICD-10-CM

## 2023-09-19 DIAGNOSIS — M9903 Segmental and somatic dysfunction of lumbar region: Secondary | ICD-10-CM

## 2023-09-19 NOTE — Assessment & Plan Note (Signed)
 Significant improvement after the epidural.  Good range of motion noted and did respond extremely well to osteopathic manipulation today.  Discussed icing regimen and home exercises, increase activity slowly.  No change in medications at the moment.  Follow-up with me again in 6 to 8 weeks.

## 2023-11-07 NOTE — Progress Notes (Unsigned)
 [facility] [address] [place] [address] Phone: [phone] Subjective:   Samantha Solomon, am serving as a scribe for Dr. Arthea Claudene.  I'm seeing this patient by the request  of:  [name], [name], MD  CC: Low back pain follow-up  [name]  Samantha Solomon is a 53 y.o. female coming in with complaint of back and neck pain. OMT 09/19/2023. Patient states has been having a lot of back trouble since last appointment. New area of discomfort in low back near R SI.  Medications patient has been prescribed: None  Taking:         Reviewed prior external information including notes and imaging from previsou exam, outside providers and external EMR if available.   As well as notes that were available from care everywhere and other healthcare systems.  Past medical history, social, surgical and family history all reviewed in electronic medical record.  No pertanent information unless stated regarding to the chief complaint.   Past Medical History:  Diagnosis Date   GERD (gastroesophageal reflux disease)    PUD (peptic ulcer disease)     No Known Allergies   Review of Systems:  No headache, visual changes, nausea, vomiting, diarrhea, constipation, dizziness, abdominal pain, skin rash, fevers, chills, night sweats, weight loss, swollen lymph nodes, body aches, joint swelling, chest pain, shortness of breath, mood changes. POSITIVE muscle aches  Objective  Blood pressure 108/64, pulse 74, height 5' 1 (1.549 m), weight 123 lb (55.8 kg), SpO2 98%.   General: No apparent distress alert and oriented x3 mood and affect normal, dressed appropriately.  HEENT: Pupils equal, extraocular movements intact  Respiratory: Patient's speak in full sentences and does not appear short of breath  Cardiovascular: No lower extremity edema, non tender, no erythema  Gait MSK:  Back continues to have low back pain seems to be more in the paraspinal  musculature on the right side mostly from L3-L5.  Worsening pain with extension of the back noted.  Negative straight leg test.  Tightness with FABER test low noted.  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  Responded to the epidurals initially but then starting to have more discomfort and pain again.  Discussed with patient about the possibility of a medial branch block and seen then if she is a possibility of a radiofrequency ablation candidate.  Patient would like to do this and will be referred.  Follow-up with me again in 2 months otherwise.     Nonallopathic problems  Decision today to treat with OMT was based on Physical Exam  After verbal consent patient was treated with HVLA, ME, FPR techniques in cervical, rib, thoracic, lumbar, and sacral  areas  Patient tolerated the procedure well with improvement in symptoms  Patient given exercises, stretches and lifestyle modifications  See medications in patient instructions if given  Patient will follow up in 4-8 weeks    The above documentation has been reviewed and is accurate and complete Samantha Solomon St[place]rs, DO          Note: This dictation was prepared with Dragon dictation along with  smaller phrase technology. Any transcriptional errors that result from this process are unintentional.

## 2023-11-09 ENCOUNTER — Encounter: Payer: Self-pay | Admitting: Family Medicine

## 2023-11-09 ENCOUNTER — Ambulatory Visit: Admitting: Family Medicine

## 2023-11-09 VITALS — BP 108/64 | HR 74 | Ht 61.0 in | Wt 123.0 lb

## 2023-11-09 DIAGNOSIS — M9908 Segmental and somatic dysfunction of rib cage: Secondary | ICD-10-CM | POA: Diagnosis not present

## 2023-11-09 DIAGNOSIS — M9904 Segmental and somatic dysfunction of sacral region: Secondary | ICD-10-CM | POA: Diagnosis not present

## 2023-11-09 DIAGNOSIS — M9902 Segmental and somatic dysfunction of thoracic region: Secondary | ICD-10-CM

## 2023-11-09 DIAGNOSIS — M9903 Segmental and somatic dysfunction of lumbar region: Secondary | ICD-10-CM

## 2023-11-09 DIAGNOSIS — M545 Low back pain, unspecified: Secondary | ICD-10-CM | POA: Diagnosis not present

## 2023-11-09 DIAGNOSIS — M9901 Segmental and somatic dysfunction of cervical region: Secondary | ICD-10-CM | POA: Diagnosis not present

## 2023-11-09 NOTE — Assessment & Plan Note (Signed)
 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  Responded to the epidurals initially but then starting to have more discomfort and pain again.  Discussed with patient about the possibility of a medial branch block and seen then if she is a possibility of a radiofrequency ablation candidate.  Patient would like to do this and will be referred.  Follow-up with me again in 2 months otherwise.

## 2023-11-09 NOTE — Patient Instructions (Addendum)
 Referral Dr. Eldonna See you again in 2 months

## 2023-11-27 ENCOUNTER — Ambulatory Visit: Admitting: Physical Medicine and Rehabilitation

## 2023-11-27 ENCOUNTER — Encounter: Payer: Self-pay | Admitting: Physical Medicine and Rehabilitation

## 2023-11-27 DIAGNOSIS — M47819 Spondylosis without myelopathy or radiculopathy, site unspecified: Secondary | ICD-10-CM

## 2023-11-27 DIAGNOSIS — M47816 Spondylosis without myelopathy or radiculopathy, lumbar region: Secondary | ICD-10-CM

## 2023-11-27 DIAGNOSIS — M545 Low back pain, unspecified: Secondary | ICD-10-CM | POA: Diagnosis not present

## 2023-11-27 DIAGNOSIS — G8929 Other chronic pain: Secondary | ICD-10-CM | POA: Diagnosis not present

## 2023-11-27 NOTE — Progress Notes (Signed)
 Pain Scale   Average Pain 10 Patient advising she has lower back pain radiating to left leg when she is running.        +Driver, -BT, -Dye Allergies.

## 2023-11-27 NOTE — Progress Notes (Signed)
 Samantha Solomon - 53 y.o. female MRN [id number]  Date of birth: 07/12/1970  Office Visit Note: Visit Date: 11/27/2023 PCP: Bobbette Coye LABOR, MD Referred by: Bobbette Coye LABOR, MD  Subjective: Chief Complaint  Patient presents with   Lower Back - Pain   HPI: Samantha Solomon is a 53 y.o. female who comes in today per the request of Dr. Arthea Sharps for evaluation of chronic, worsening and severe bilateral lower back pain, right greater than left. Pain ongoing for 20 plus year, worsens with activity and movement. Severe pain noted when moving from sitting to standing position. She describes pain as sharp and stabbing sensation, 10 out of 10 pain with activity. Some relief of pain with home exercise regimen, rest and use of medications. History of formal physical therapy/dry needling with increased pain. She does participate in [facility] several times a week  Lumbar MRI imaging from January of this year shows facet arthropathy and moderate bilateral foraminal stenosis at L3-L4 and L4-L5. She has undergone multiple left L4-L5 interlaminar epidural steroid injections at [facility] in February and May of this year, significant relief of leg symptoms with these procedures. Her lower back remains and continues to limit her functional ability. Patient denies focal weakness. No recent trauma or falls.      Review of Systems  Musculoskeletal:  Positive for back pain.  Neurological:  Negative for tingling, sensory change, focal weakness and weakness.  All other systems reviewed and are negative.  Otherwise per HPI.  Assessment & Plan: Visit Diagnoses:    ICD-10-CM   1. Chronic bilateral low back pain without sciatica  M54.50 Ambulatory referral to Physical Medicine Rehab   G89.29     2. Spondylosis without myelopathy or radiculopathy  M47.819 Ambulatory referral to Physical Medicine Rehab    3. Facet arthropathy, lumbar  M47.816 Ambulatory referral to Physical Medicine Rehab       Plan:  Findings:  Chronic, worsening and severe bilateral lower back pain, right greater than left. Patient continues to have severe pain despite good conservative therapies such as formal physical therapy/dry needling, home exercise regimen, rest and use of medications. Patients clinical presentation and exam are consistent with facet mediated pain. There is facet arthropathy noted at L3-L4 and L4-L5. We discussed treatment plan in detail today, next step is to perform diagnostic bilateral L3-L4 and L4-L5 medial branch blocks under fluoroscopic guidance. If good relief of pain with medial branch blocks we discussed possibility of longer sustained pain relief with radiofrequency ablation. We had a long conversation regarding lumbar facet arthropathy, I explained to her that typically spine surgeons do not perform lower back surgery for axial back pain, surgery is reserved for more nerve impingement. We will proceed with diagnostic medial branch blocks with a plan of radiofrequency ablation. She has no questions at this time. Can follow up with Dr. Sharps for continued lumbar epidural steroid injections at [facility]. No red flag symptoms noted upon exam today.     Meds & Orders: No orders of the defined types were placed in this encounter.   Orders Placed This Encounter  Procedures   Ambulatory referral to Physical Medicine Rehab    Follow-up: Return for Bilateral L3-L4 and L4-L5 medial branch blocks.   Procedures: No procedures performed      Clinical History: [name] CHRISTELLA PONCE, DO - 05/02/2023  Formatting of this note might be different from the original.  MRI lumbar spine:   TECHNIQUE: Sagittal and axial T1 and T2-weighted sequences  were performed. Additional sagittal STIR images were performed.   INDICATION: Lumbar radiculopathy   COMPARISON: None   FINDINGS:  # Lumbar alignment is preserved.  #  Vertebral body heights are well maintained.  #  The marrow signal intensity is normal.  #   Conus terminates at L1 without evidence of tethering.  #  Nerve roots appear normal.  #  Incidental findings: None.    #  L1-2: Normal.  #   #  L2-3: Facet arthropathy. Central canal is well-maintained. Neuroforamina are patent.  #   #  L3-4: Moderate degenerative disc disease. There is facet arthropathy and disc bulge causing mild central canal stenosis with moderate bilateral neuroforaminal narrowing.  #   #  L4-5: Mild degenerative disc disease. Facet arthropathy and disc bulge causing mild central canal stenosis. There is moderate bilateral neuroforaminal narrowing.  #   #  L5-S1: Normal.    IMPRESSION:   Lower lumbar facet arthropathy and disc bulging causing mild central canal stenosis and moderate neuroforaminal narrowing at the levels of L3-4 and L4-5 as above.   Electronically Signed by: Dallas Jubilee, MD on 05/02/2023 1:01 PM   She reports that she has never smoked. She has never used smokeless tobacco.  Recent Labs    03/14/23 1005  LABURIC 5.0    Objective:  VS:  HT:    WT:   BMI:     BP:   HR: bpm  TEMP: ( )  RESP:  Physical Exam Vitals and nursing note reviewed.  HENT:     Head: Normocephalic and atraumatic.     Right Ear: External ear normal.     Left Ear: External ear normal.     Nose: Nose normal.     Mouth/Throat:     Mouth: Mucous membranes are moist.  Eyes:     Extraocular Movements: Extraocular movements intact.  Cardiovascular:     Rate and Rhythm: Normal rate.     Pulses: Normal pulses.  Pulmonary:     Effort: Pulmonary effort is normal.  Abdominal:     General: Abdomen is flat. There is no distension.  Musculoskeletal:        General: Tenderness present.     Cervical back: Normal range of motion.     Comments: Patient rises from seated position to standing without difficulty. Pain noted with facet loading and lumbar extension. 5/5 strength noted with bilateral hip flexion, knee flexion/extension, ankle dorsiflexion/plantarflexion and EHL.  No clonus noted bilaterally. No pain upon palpation of greater trochanters. No pain with internal/external rotation of bilateral hips. Sensation intact bilaterally. Negative slump test bilaterally. Ambulates without aid, gait steady.     Skin:    General: Skin is warm and dry.     Capillary Refill: Capillary refill takes less than 2 seconds.  Neurological:     General: No focal deficit present.     Mental Status: She is alert and oriented to person, place, and time.  Psychiatric:        Mood and Affect: Mood normal.        Behavior: Behavior normal.     Ortho Exam  Imaging: No results found.  Past Medical/Family/Surgical/Social History: Medications & Allergies reviewed per EMR, new medications updated. Patient Active Problem List   Diagnosis Date Noted   Polyarthralgia 03/14/2023   History of DVT (deep vein thrombosis) 01/07/2022   Right foot pain 09/03/2021   Low back pain 07/23/2021   Somatic dysfunction of spine, sacral 07/23/2021  Palpitations 05/27/2021   Family history of early CAD 05/27/2021   Pure hypercholesterolemia 05/27/2021   Past Medical History:  Diagnosis Date   GERD (gastroesophageal reflux disease)    PUD (peptic ulcer disease)    Family History  Problem Relation Age of Onset   Heart attack Paternal Grandfather        In his 38s   Past Surgical History:  Procedure Laterality Date   ABDOMINAL HYSTERECTOMY     APPENDECTOMY     CESAREAN SECTION     Social History   Occupational History   Not on file  Tobacco Use   Smoking status: Never   Smokeless tobacco: Never  Substance and Sexual Activity   Alcohol use: Not Currently   Drug use: Never   Sexual activity: Not on file

## 2024-01-05 NOTE — Progress Notes (Signed)
 [facility] [address] [place] [address] Phone: [phone] Subjective:   Samantha Solomon, am serving as a scribe for Dr. Arthea Claudene.  I'm seeing this patient by the request  of:  [name], [name], MD  CC: Back and neck pain follow-up  [name]  Samantha Solomon is a 53 y.o. female coming in with complaint of back and neck pain. OMT 11/09/2023. Patient states that injection did not do anything for numbness in her foot. Pain did not lessen with injection. Patient has not run in 3 months. Tried jogging recently and she had pain but that subsided as she continued on. Wants to know if she could continue running. Has been trying to use less weight in the gym.   Medications patient has been prescribed: None  Taking:         Reviewed prior external information including notes and imaging from previsou exam, outside providers and external EMR if available.   As well as notes that were available from care everywhere and other healthcare systems.  Past medical history, social, surgical and family history all reviewed in electronic medical record.  No pertanent information unless stated regarding to the chief complaint.   Past Medical History:  Diagnosis Date   GERD (gastroesophageal reflux disease)    PUD (peptic ulcer disease)     No Known Allergies   Review of Systems:  No headache, visual changes, nausea, vomiting, diarrhea, constipation, dizziness, abdominal pain, skin rash, fevers, chills, night sweats, weight loss, swollen lymph nodes, body aches, joint swelling, chest pain, shortness of breath, mood changes. POSITIVE muscle aches  Objective  Blood pressure 102/72, pulse 69, height 5' 1 (1.549 m), weight 124 lb (56.2 kg), SpO2 98%.   General: No apparent distress alert and oriented x3 mood and affect normal, dressed appropriately.  HEENT: Pupils equal, extraocular movements intact  Respiratory: Patient's speak in full  sentences and does not appear short of breath  Cardiovascular: No lower extremity edema, non tender, no erythema  Gait MSK:  Back does have some mild loss of lordosis noted.  Some tenderness to palpation more over the sacroiliac joint on the left side and the left paraspinal musculature.  Left foot exam shows the patient does have some breakdown of the transverse arch noted.  Mild positive squeeze test noted.  Patient has good ankle mobility noted.  No swelling of the lower extremities noted.  Limited muscular skeletal ultrasound was performed and interpreted by CLAUDENE ARTHEA, M  Limited ultrasound shows patient does have hypoechoic changes within the 2nd and 3rd metatarsal area consistent with a small neuroma.  No cortical breakdown of any of the musculature noted.  Osteopathic findings  C2 flexed rotated and side bent right C6 flexed rotated and side bent left T3 extended rotated and side bent right inhaled rib T9 extended rotated and side bent left L2 flexed rotated and side bent left Sacrum r left on left       Assessment and Plan:  Low back pain Continues to have some discomfort and pain.  Has some radicular symptoms that are consistent with the left side.  We discussed with patient again different treatment options.  He did have the medial branch block but has not noticed significant improvement at this time.  Increase activity slowly overall.  Follow-up with me again in 6 to 8 weeks.  Responds well to osteopathic manipulation.  Loss of transverse plantar arch of left foot Ultrasound does show the patient does  have a neuroma noted of the left foot.  Discussed potential injection.  Discussed icing regimen and home exercises, discussed which activities to do and which ones to avoid.  Increase activity slowly.  Discussed icing regimen.  Follow-up again in 6 to 8 weeks.    Nonallopathic problems  Decision today to treat with OMT was based on Physical Exam  After verbal consent  patient was treated with HVLA, ME, FPR techniques in cervical, rib, thoracic, lumbar, and sacral  areas  Patient tolerated the procedure well with improvement in symptoms  Patient given exercises, stretches and lifestyle modifications  See medications in patient instructions if given  Patient will follow up in 4-8 weeks     The above documentation has been reviewed and is accurate and complete Samantha Solomon M Samantha Schlueter, DO         Note: This dictation was prepared with Dragon dictation along with smaller phrase technology. Any transcriptional errors that result from this process are unintentional.

## 2024-01-08 ENCOUNTER — Other Ambulatory Visit: Payer: Self-pay

## 2024-01-08 ENCOUNTER — Ambulatory Visit: Admitting: Physical Medicine and Rehabilitation

## 2024-01-08 VITALS — BP 126/83 | HR 62

## 2024-01-08 DIAGNOSIS — M47819 Spondylosis without myelopathy or radiculopathy, site unspecified: Secondary | ICD-10-CM

## 2024-01-08 MED ORDER — BUPIVACAINE HCL 0.5 % IJ SOLN
3.0000 mL | Freq: Once | INTRAMUSCULAR | Status: AC
  Administered 2024-01-08: 3 mL

## 2024-01-08 NOTE — Progress Notes (Signed)
 Pain Scale   Average Pain 2 Patient advising her pain is chronic however it comes and goes and causes numbness and tingling to her left foot        +Driver, -BT, -Dye Allergies.

## 2024-01-09 ENCOUNTER — Other Ambulatory Visit: Payer: Self-pay

## 2024-01-09 ENCOUNTER — Ambulatory Visit: Admitting: Family Medicine

## 2024-01-09 ENCOUNTER — Encounter: Payer: Self-pay | Admitting: Family Medicine

## 2024-01-09 VITALS — BP 102/72 | HR 69 | Ht 61.0 in | Wt 124.0 lb

## 2024-01-09 DIAGNOSIS — M79605 Pain in left leg: Secondary | ICD-10-CM | POA: Diagnosis not present

## 2024-01-09 DIAGNOSIS — M79604 Pain in right leg: Secondary | ICD-10-CM | POA: Diagnosis not present

## 2024-01-09 DIAGNOSIS — M9902 Segmental and somatic dysfunction of thoracic region: Secondary | ICD-10-CM | POA: Diagnosis not present

## 2024-01-09 DIAGNOSIS — M216X2 Other acquired deformities of left foot: Secondary | ICD-10-CM | POA: Diagnosis not present

## 2024-01-09 DIAGNOSIS — M9908 Segmental and somatic dysfunction of rib cage: Secondary | ICD-10-CM | POA: Diagnosis not present

## 2024-01-09 DIAGNOSIS — M9904 Segmental and somatic dysfunction of sacral region: Secondary | ICD-10-CM | POA: Diagnosis not present

## 2024-01-09 DIAGNOSIS — M9901 Segmental and somatic dysfunction of cervical region: Secondary | ICD-10-CM

## 2024-01-09 DIAGNOSIS — M545 Low back pain, unspecified: Secondary | ICD-10-CM

## 2024-01-09 DIAGNOSIS — M9903 Segmental and somatic dysfunction of lumbar region: Secondary | ICD-10-CM

## 2024-01-09 NOTE — Assessment & Plan Note (Signed)
 Ultrasound does show the patient does have a neuroma noted of the left foot.  Discussed potential injection.  Discussed icing regimen and home exercises, discussed which activities to do and which ones to avoid.  Increase activity slowly.  Discussed icing regimen.  Follow-up again in 6 to 8 weeks.

## 2024-01-09 NOTE — Assessment & Plan Note (Signed)
 Continues to have some discomfort and pain.  Has some radicular symptoms that are consistent with the left side.  We discussed with patient again different treatment options.  He did have the medial branch block but has not noticed significant improvement at this time.  Increase activity slowly overall.  Follow-up with me again in 6 to 8 weeks.  Responds well to osteopathic manipulation.

## 2024-01-09 NOTE — Patient Instructions (Signed)
 Good to see you Transverse arch Spenco Total Support Orthotics Original See me in 6-8 weeks

## 2024-01-11 ENCOUNTER — Encounter: Payer: Self-pay | Admitting: Physical Medicine and Rehabilitation

## 2024-01-11 DIAGNOSIS — M47816 Spondylosis without myelopathy or radiculopathy, lumbar region: Secondary | ICD-10-CM

## 2024-01-15 NOTE — Procedures (Signed)
 Lumbar Diagnostic Facet Joint Nerve Block with Fluoroscopic Guidance   Patient: Samantha Solomon      Date of Birth: 1970-08-06 MRN: [id number] PCP: Bobbette Coye LABOR, MD      Visit Date: 01/08/2024   Universal Protocol:    Date/Time: 10/13/256:44 AM  Consent Given By: the patient  Position: PRONE  Additional Comments: Vital signs were monitored before and after the procedure. Patient was prepped and draped in the usual sterile fashion. The correct patient, procedure, and site was verified.   Injection Procedure Details:   Procedure diagnoses:  1. Spondylosis without myelopathy or radiculopathy      Meds Administered:  Meds ordered this encounter  Medications   bupivacaine (MARCAINE) 0.5 % (with pres) injection 3 mL     Laterality: Bilateral  Location/Site: L3-L4, L2 and L3 medial branches and L4-L5, L3 and L4 medial branches  Needle: 5.0 in., 25 ga.  Short bevel or Quincke spinal needle  Needle Placement: Oblique pedical  Findings:   -Comments: There was excellent flow of contrast along the articular pillars without intravascular flow.  Procedure Details: The fluoroscope beam is vertically oriented in AP and then obliqued 15 to 20 degrees to the ipsilateral side of the desired nerve to achieve the "Scotty dog" appearance.  The skin over the target area of the junction of the superior articulating process and the transverse process (sacral ala if blocking the L5 dorsal rami) was locally anesthetized with a 1 ml volume of 1% Lidocaine without Epinephrine.  The spinal needle was inserted and advanced in a trajectory view down to the target.   After contact with periosteum and negative aspirate for blood and CSF, correct placement without intravascular or epidural spread was confirmed by injecting 0.5 ml. of Isovue -250.  A spot radiograph was obtained of this image.    Next, a 0.5 ml. volume of the injectate described above was injected. The needle was then redirected to  the other facet joint nerves mentioned above if needed.  Prior to the procedure, the patient was given a Pain Diary which was completed for baseline measurements.  After the procedure, the patient rated their pain every 30 minutes and will continue rating at this frequency for a total of 5 hours.  The patient has been asked to complete the Diary and return to us  by mail, fax or hand delivered as soon as possible.   Additional Comments:  The patient tolerated the procedure well Dressing: 2 x 2 sterile gauze and Band-Aid    Post-procedure details: Patient was observed during the procedure. Post-procedure instructions were reviewed.  Patient left the clinic in stable condition.

## 2024-01-15 NOTE — Progress Notes (Signed)
 Samantha Solomon - 53 y.o. female MRN [id number]  Date of birth: 10/01/1970  Office Visit Note: Visit Date: 01/08/2024 PCP: [name], [name], MD Referred by: Bobbette Coye LABOR, MD  Subjective: No chief complaint on file.  HPI:  Samantha Solomon is a 53 y.o. female who comes in today at the request of Duwaine Pouch, FNP for planned Bilateral  L3-4 and L4-5 Lumbar facet/medial branch block with fluoroscopic guidance.  The patient has failed conservative care including home exercise, medications, time and activity modification.  This injection will be diagnostic and hopefully therapeutic.  Please see requesting physician notes for further details and justification.  Exam has shown concordant pain with facet joint loading.   ROS Otherwise per HPI.  Assessment & Plan: Visit Diagnoses:    ICD-10-CM   1. Spondylosis without myelopathy or radiculopathy  M47.819 XR C-ARM NO REPORT    Nerve Block    bupivacaine (MARCAINE) 0.5 % (with pres) injection 3 mL      Plan: No additional findings.   Meds & Orders:  Meds ordered this encounter  Medications   bupivacaine (MARCAINE) 0.5 % (with pres) injection 3 mL    Orders Placed This Encounter  Procedures   Nerve Block   XR C-ARM NO REPORT    Follow-up: Return for Review Pain Diary.   Procedures: No procedures performed  Lumbar Diagnostic Facet Joint Nerve Block with Fluoroscopic Guidance   Patient: Samantha Solomon      Date of Birth: 08/04/1970 MRN: [id number] PCP: Bobbette Coye LABOR, MD      Visit Date: 01/08/2024   Universal Protocol:    Date/Time: 10/13/256:44 AM  Consent Given By: the patient  Position: PRONE  Additional Comments: Vital signs were monitored before and after the procedure. Patient was prepped and draped in the usual sterile fashion. The correct patient, procedure, and site was verified.   Injection Procedure Details:   Procedure diagnoses:  1. Spondylosis without myelopathy or radiculopathy       Meds Administered:  Meds ordered this encounter  Medications   bupivacaine (MARCAINE) 0.5 % (with pres) injection 3 mL     Laterality: Bilateral  Location/Site: L3-L4, L2 and L3 medial branches and L4-L5, L3 and L4 medial branches  Needle: 5.0 in., 25 ga.  Short bevel or Quincke spinal needle  Needle Placement: Oblique pedical  Findings:   -Comments: There was excellent flow of contrast along the articular pillars without intravascular flow.  Procedure Details: The fluoroscope beam is vertically oriented in AP and then obliqued 15 to 20 degrees to the ipsilateral side of the desired nerve to achieve the "Scotty dog" appearance.  The skin over the target area of the junction of the superior articulating process and the transverse process (sacral ala if blocking the L5 dorsal rami) was locally anesthetized with a 1 ml volume of 1% Lidocaine without Epinephrine.  The spinal needle was inserted and advanced in a trajectory view down to the target.   After contact with periosteum and negative aspirate for blood and CSF, correct placement without intravascular or epidural spread was confirmed by injecting 0.5 ml. of Isovue -250.  A spot radiograph was obtained of this image.    Next, a 0.5 ml. volume of the injectate described above was injected. The needle was then redirected to the other facet joint nerves mentioned above if needed.  Prior to the procedure, the patient was given a Pain Diary which was completed for baseline measurements.  After the procedure, the patient rated their  pain every 30 minutes and will continue rating at this frequency for a total of 5 hours.  The patient has been asked to complete the Diary and return to us  by mail, fax or hand delivered as soon as possible.   Additional Comments:  The patient tolerated the procedure well Dressing: 2 x 2 sterile gauze and Band-Aid    Post-procedure details: Patient was observed during the procedure. Post-procedure  instructions were reviewed.  Patient left the clinic in stable condition.   Clinical History: Samantha Solomon CHRISTELLA PONCE, DO - 05/02/2023  Formatting of this note might be different from the original.  MRI lumbar spine:   TECHNIQUE: Sagittal and axial T1 and T2-weighted sequences were performed. Additional sagittal STIR images were performed.   INDICATION: Lumbar radiculopathy   COMPARISON: None   FINDINGS:  # Lumbar alignment is preserved.  #  Vertebral body heights are well maintained.  #  The marrow signal intensity is normal.  #  Conus terminates at L1 without evidence of tethering.  #  Nerve roots appear normal.  #  Incidental findings: None.    #  L1-2: Normal.  #   #  L2-3: Facet arthropathy. Central canal is well-maintained. Neuroforamina are patent.  #   #  L3-4: Moderate degenerative disc disease. There is facet arthropathy and disc bulge causing mild central canal stenosis with moderate bilateral neuroforaminal narrowing.  #   #  L4-5: Mild degenerative disc disease. Facet arthropathy and disc bulge causing mild central canal stenosis. There is moderate bilateral neuroforaminal narrowing.  #   #  L5-S1: Normal.    IMPRESSION:   Lower lumbar facet arthropathy and disc bulging causing mild central canal stenosis and moderate neuroforaminal narrowing at the levels of L3-4 and L4-5 as above.   Electronically Signed by: Solomon Wilfred, MD on 05/02/2023 1:01 PM     Objective:  VS:  HT:    WT:   BMI:     BP:126/83  HR:62bpm  TEMP: ( )  RESP:  Physical Exam Vitals and nursing note reviewed.  Constitutional:      General: She is not in acute distress.    Appearance: Normal appearance. She is not ill-appearing.  HENT:     Head: Normocephalic and atraumatic.     Right Ear: External ear normal.     Left Ear: External ear normal.  Eyes:     Extraocular Movements: Extraocular movements intact.  Cardiovascular:     Rate and Rhythm: Normal rate.     Pulses: Normal  pulses.  Pulmonary:     Effort: Pulmonary effort is normal. No respiratory distress.  Abdominal:     General: There is no distension.     Palpations: Abdomen is soft.  Musculoskeletal:        General: Tenderness present.     Cervical back: Neck supple.     Right lower leg: No edema.     Left lower leg: No edema.     Comments: Patient has good distal strength with no pain over the greater trochanters.  No clonus or focal weakness.  Skin:    Findings: No erythema, lesion or rash.  Neurological:     General: No focal deficit present.     Mental Status: She is alert and oriented to person, place, and time.     Sensory: No sensory deficit.     Motor: No weakness or abnormal muscle tone.     Coordination: Coordination normal.  Psychiatric:  Mood and Affect: Mood normal.        Behavior: Behavior normal.      Imaging: No results found.

## 2024-02-05 ENCOUNTER — Encounter: Payer: Self-pay | Admitting: Radiology

## 2024-02-08 ENCOUNTER — Ambulatory Visit: Admitting: Physical Medicine and Rehabilitation

## 2024-02-08 ENCOUNTER — Other Ambulatory Visit: Payer: Self-pay

## 2024-02-08 VITALS — BP 121/61 | HR 61

## 2024-02-08 DIAGNOSIS — M47816 Spondylosis without myelopathy or radiculopathy, lumbar region: Secondary | ICD-10-CM | POA: Diagnosis not present

## 2024-02-08 MED ORDER — METHYLPREDNISOLONE ACETATE 40 MG/ML IJ SUSP: 40.0000 mg | Freq: Once | INTRAMUSCULAR | Status: DC

## 2024-02-08 NOTE — Progress Notes (Signed)
 Pain Scale   Average Pain 1 Patient advising her pain is lower back but mostly Numbness ans tingling to right foot.        +Driver, -BT, -Dye Allergies.

## 2024-02-18 NOTE — Progress Notes (Signed)
 Samantha Solomon - 53 y.o. female MRN [id number]  Date of birth: 1970-04-13  Office Visit Note: Visit Date: 02/08/2024 PCP: Bobbette Coye LABOR, MD Referred by: Bobbette Coye LABOR, MD  Subjective: Chief Complaint  Patient presents with   Lower Back - Pain   HPI:  Samantha Solomon is a 53 y.o. female who comes in today for planned repeat Bilateral L3-4 and L4-5 Lumbar facet/medial branch block with fluoroscopic guidance.  The patient has failed conservative care including home exercise, medications, time and activity modification.  This injection will be diagnostic and hopefully therapeutic.  Please see requesting physician notes for further details and justification.  Exam shows concordant low back pain with facet joint loading and extension. Patient received more than 80% pain relief from prior injection. This would be the second block in a diagnostic double block paradigm.     Referring:Megan Trudy, FNP   ROS Otherwise per HPI.  Assessment & Plan: Visit Diagnoses:    ICD-10-CM   1. Spondylosis without myelopathy or radiculopathy, lumbar region  M47.816 Facet Injection    DISCONTINUED: methylPREDNISolone  acetate (DEPO-MEDROL ) injection 40 mg    CANCELED: XR C-ARM NO REPORT      Plan: No additional findings.   Meds & Orders:  Meds ordered this encounter  Medications   DISCONTD: methylPREDNISolone  acetate (DEPO-MEDROL ) injection 40 mg    Orders Placed This Encounter  Procedures   Facet Injection    Follow-up: Return for Review Pain Diary.   Procedures: No procedures performed  Lumbar Diagnostic Facet Joint Nerve Block with Fluoroscopic Guidance   Patient: Samantha Solomon      Date of Birth: 07-17-1970 MRN: [id number] PCP: Bobbette Coye LABOR, MD      Visit Date: 02/08/2024   Universal Protocol:    Date/Time: 02/18/2511:08 PM  Consent Given By: the patient  Position: PRONE  Additional Comments: Vital signs were monitored before and after the procedure. Patient  was prepped and draped in the usual sterile fashion. The correct patient, procedure, and site was verified.   Injection Procedure Details:   Procedure diagnoses:  1. Spondylosis without myelopathy or radiculopathy, lumbar region      Meds Administered:  Meds ordered this encounter  Medications   methylPREDNISolone  acetate (DEPO-MEDROL ) injection 40 mg     Laterality: Bilateral  Location/Site: L3-L4, L2 and L3 medial branches and L4-L5, L3 and L4 medial branches  Needle: 5.0 in., 25 ga.  Short bevel or Quincke spinal needle  Needle Placement: Oblique pedical  Findings:   -Comments: There was excellent flow of contrast along the articular pillars without intravascular flow.  Procedure Details: The fluoroscope beam is vertically oriented in AP and then obliqued 15 to 20 degrees to the ipsilateral side of the desired nerve to achieve the "Scotty dog" appearance.  The skin over the target area of the junction of the superior articulating process and the transverse process (sacral ala if blocking the L5 dorsal rami) was locally anesthetized with a 1 ml volume of 1% Lidocaine without Epinephrine.  The spinal needle was inserted and advanced in a trajectory view down to the target.   After contact with periosteum and negative aspirate for blood and CSF, correct placement without intravascular or epidural spread was confirmed by injecting 0.5 ml. of Isovue -250.  A spot radiograph was obtained of this image.    Next, a 0.5 ml. volume of the injectate described above was injected. The needle was then redirected to the other facet joint nerves mentioned above if needed.  Prior to the procedure, the patient was given a Pain Diary which was completed for baseline measurements.  After the procedure, the patient rated their pain every 30 minutes and will continue rating at this frequency for a total of 5 hours.  The patient has been asked to complete the Diary and return to us  by mail, fax or hand  delivered as soon as possible.   Additional Comments:  The patient tolerated the procedure well Dressing: 2 x 2 sterile gauze and Band-Aid    Post-procedure details: Patient was observed during the procedure. Post-procedure instructions were reviewed.  Patient left the clinic in stable condition.   Clinical History: Samantha Samantha CHRISTELLA PONCE, DO - 05/02/2023  Formatting of this note might be different from the original.  MRI lumbar spine:   TECHNIQUE: Sagittal and axial T1 and T2-weighted sequences were performed. Additional sagittal STIR images were performed.   INDICATION: Lumbar radiculopathy   COMPARISON: None   FINDINGS:  # Lumbar alignment is preserved.  #  Vertebral body heights are well maintained.  #  The marrow signal intensity is normal.  #  Conus terminates at L1 without evidence of tethering.  #  Nerve roots appear normal.  #  Incidental findings: None.    #  L1-2: Normal.  #   #  L2-3: Facet arthropathy. Central canal is well-maintained. Neuroforamina are patent.  #   #  L3-4: Moderate degenerative disc disease. There is facet arthropathy and disc bulge causing mild central canal stenosis with moderate bilateral neuroforaminal narrowing.  #   #  L4-5: Mild degenerative disc disease. Facet arthropathy and disc bulge causing mild central canal stenosis. There is moderate bilateral neuroforaminal narrowing.  #   #  L5-S1: Normal.    IMPRESSION:   Lower lumbar facet arthropathy and disc bulging causing mild central canal stenosis and moderate neuroforaminal narrowing at the levels of L3-4 and L4-5 as above.   Electronically Signed by: Samantha Wilfred, MD on 05/02/2023 1:01 PM     Objective:  VS:  HT:    WT:   BMI:     BP:121/61  HR:61bpm  TEMP: ( )  RESP:  Physical Exam Vitals and nursing note reviewed.  Constitutional:      General: She is not in acute distress.    Appearance: Normal appearance. She is not ill-appearing.  HENT:     Head: Normocephalic  and atraumatic.     Right Ear: External ear normal.     Left Ear: External ear normal.  Eyes:     Extraocular Movements: Extraocular movements intact.  Cardiovascular:     Rate and Rhythm: Normal rate.     Pulses: Normal pulses.  Pulmonary:     Effort: Pulmonary effort is normal. No respiratory distress.  Abdominal:     General: There is no distension.     Palpations: Abdomen is soft.  Musculoskeletal:        General: Tenderness present.     Cervical back: Neck supple.     Right lower leg: No edema.     Left lower leg: No edema.     Comments: Patient has good distal strength with no pain over the greater trochanters.  No clonus or focal weakness.  Skin:    Findings: No erythema, lesion or rash.  Neurological:     General: No focal deficit present.     Mental Status: She is alert and oriented to person, place, and time.     Sensory: No sensory  deficit.     Motor: No weakness or abnormal muscle tone.     Coordination: Coordination normal.  Psychiatric:        Mood and Affect: Mood normal.        Behavior: Behavior normal.      Imaging: No results found.

## 2024-02-18 NOTE — Procedures (Signed)
 Lumbar Diagnostic Facet Joint Nerve Block with Fluoroscopic Guidance   Patient: Samantha Solomon      Date of Birth: Mar 06, 1971 MRN: [id number] PCP: Bobbette Coye LABOR, MD      Visit Date: 02/08/2024   Universal Protocol:    Date/Time: 02/18/2511:08 PM  Consent Given By: the patient  Position: PRONE  Additional Comments: Vital signs were monitored before and after the procedure. Patient was prepped and draped in the usual sterile fashion. The correct patient, procedure, and site was verified.   Injection Procedure Details:   Procedure diagnoses:  1. Spondylosis without myelopathy or radiculopathy, lumbar region      Meds Administered:  Meds ordered this encounter  Medications   methylPREDNISolone  acetate (DEPO-MEDROL ) injection 40 mg     Laterality: Bilateral  Location/Site: L3-L4, L2 and L3 medial branches and L4-L5, L3 and L4 medial branches  Needle: 5.0 in., 25 ga.  Short bevel or Quincke spinal needle  Needle Placement: Oblique pedical  Findings:   -Comments: There was excellent flow of contrast along the articular pillars without intravascular flow.  Procedure Details: The fluoroscope beam is vertically oriented in AP and then obliqued 15 to 20 degrees to the ipsilateral side of the desired nerve to achieve the "Scotty dog" appearance.  The skin over the target area of the junction of the superior articulating process and the transverse process (sacral ala if blocking the L5 dorsal rami) was locally anesthetized with a 1 ml volume of 1% Lidocaine without Epinephrine.  The spinal needle was inserted and advanced in a trajectory view down to the target.   After contact with periosteum and negative aspirate for blood and CSF, correct placement without intravascular or epidural spread was confirmed by injecting 0.5 ml. of Isovue -250.  A spot radiograph was obtained of this image.    Next, a 0.5 ml. volume of the injectate described above was injected. The needle was  then redirected to the other facet joint nerves mentioned above if needed.  Prior to the procedure, the patient was given a Pain Diary which was completed for baseline measurements.  After the procedure, the patient rated their pain every 30 minutes and will continue rating at this frequency for a total of 5 hours.  The patient has been asked to complete the Diary and return to us  by mail, fax or hand delivered as soon as possible.   Additional Comments:  The patient tolerated the procedure well Dressing: 2 x 2 sterile gauze and Band-Aid    Post-procedure details: Patient was observed during the procedure. Post-procedure instructions were reviewed.  Patient left the clinic in stable condition.

## 2024-02-23 NOTE — Progress Notes (Unsigned)
 [facility] [address] [place] [address] Phone: [phone] Subjective:   Samantha Solomon, am serving as a scribe for Dr. Arthea Claudene.  I'm seeing this patient by the request  of:  [name], [name], MD  CC: Back pain and neck pain follow-up  [name]  Samantha Solomon is a 53 y.o. female coming in with complaint of back and neck pain. OMT on 01/09/2024. 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 Patient states that she is not able to get abliation as 1st MBB did not work. Pain in lower L side of lumbar spine.   Wearing insoles and they are helping. Has been doing foot strengthening.   Medications patient has been prescribed:   Taking:         Reviewed prior external information including notes and imaging from previsou exam, outside providers and external EMR if available.   As well as notes that were available from care everywhere and other healthcare systems.  Past medical history, social, surgical and family history all reviewed in electronic medical record.  No pertanent information unless stated regarding to the chief complaint.   Past Medical History:  Diagnosis Date   GERD (gastroesophageal reflux disease)    PUD (peptic ulcer disease)     No Known Allergies   Review of Systems:  No headache, visual changes, nausea, vomiting, diarrhea, constipation, dizziness, abdominal pain, skin rash, fevers, chills, night sweats, weight loss, swollen lymph nodes, body aches, joint swelling, chest pain, shortness of breath, mood changes. POSITIVE muscle aches  Objective  Blood pressure 108/78, height 5' 1 (1.549 m), weight 124 lb (56.2 kg).   General: No apparent distress alert and oriented x3 mood and affect normal, dressed appropriately.  HEENT: Pupils equal, extraocular movements intact  Respiratory: Patient's speak in full sentences and does not appear short of breath  Cardiovascular: No lower extremity edema, non tender, no erythema   Gait MSK:  Back does have some loss lordosis noted.  Some tenderness to palpation in the paraspinal musculature.  Patient does have some loss of extension of 5 degrees of the back.  Osteopathic findings  C6 flexed rotated and side bent left T3 extended rotated and side bent right inhaled rib T9 extended rotated and side bent left L2 flexed rotated and side bent right L3 flexed rotated right side bent left L4 flexed rotated and side bent right Sacrum right on right     Assessment and Plan:  Low back pain Discussed with patient about the nerve root impingement.  Discussed which activities to do and which ones to avoid.  Increase activity slowly.  Discussed icing regimen.  Increase activity slowly.  I discussed the possibility of Cymbalta which patient wanted to hold on.  Did respond extremely well to osteopathic manipulation.  Increase activity slowly.  Follow-up again in 6 to 12 weeks    Nonallopathic problems  Decision today to treat with OMT was based on Physical Exam  After verbal consent patient was treated with HVLA, ME, FPR techniques in cervical, rib, thoracic, lumbar, and sacral  areas  Patient tolerated the procedure well with improvement in symptoms  Patient given exercises, stretches and lifestyle modifications  See medications in patient instructions if given  Patient will follow up in 4-8 weeks      The above documentation has been reviewed and is accurate and complete Samantha Bower M Akiya Morr, DO        Note: This dictation was prepared with Dragon dictation along with smaller  lobbyist. Any transcriptional errors that result from this process are unintentional.

## 2024-02-28 ENCOUNTER — Encounter: Payer: Self-pay | Admitting: Family Medicine

## 2024-02-28 ENCOUNTER — Ambulatory Visit: Admitting: Family Medicine

## 2024-02-28 VITALS — BP 108/78 | Ht 61.0 in | Wt 124.0 lb

## 2024-02-28 DIAGNOSIS — M9901 Segmental and somatic dysfunction of cervical region: Secondary | ICD-10-CM

## 2024-02-28 DIAGNOSIS — M9904 Segmental and somatic dysfunction of sacral region: Secondary | ICD-10-CM | POA: Diagnosis not present

## 2024-02-28 DIAGNOSIS — M9902 Segmental and somatic dysfunction of thoracic region: Secondary | ICD-10-CM

## 2024-02-28 DIAGNOSIS — M9903 Segmental and somatic dysfunction of lumbar region: Secondary | ICD-10-CM | POA: Diagnosis not present

## 2024-02-28 DIAGNOSIS — M545 Low back pain, unspecified: Secondary | ICD-10-CM

## 2024-02-28 DIAGNOSIS — M9908 Segmental and somatic dysfunction of rib cage: Secondary | ICD-10-CM | POA: Diagnosis not present

## 2024-02-28 NOTE — Assessment & Plan Note (Signed)
 Discussed with patient about the nerve root impingement.  Discussed which activities to do and which ones to avoid.  Increase activity slowly.  Discussed icing regimen.  Increase activity slowly.  I discussed the possibility of Cymbalta which patient wanted to hold on.  Did respond extremely well to osteopathic manipulation.  Increase activity slowly.  Follow-up again in 6 to 12 weeks

## 2024-04-16 NOTE — Progress Notes (Unsigned)
 " [facility] [address] [place] [address] Phone: [phone] Subjective:   Samantha Solomon am a scribe for Dr. Claudene.   I'm seeing this patient by the request  of:  [name], [name], MD  CC: Back and neck pain follow-up, left knee pain  [name]  [name] is a 54 y.o. female coming in with complaint of back and neck pain. OMT on 02/28/2024. Patient states that she hurt her back a couple of days ago. Not sure how she hurt it but it hurts. Neck is ok. Would like to talk to the doctor about her left knee. After going skiiing it hurt. It feels weak and sometimes she can't squat or get up from a bent position.   Medications patient has been prescribed:   Taking:         Reviewed prior external information including notes and imaging from previsou exam, outside providers and external EMR if available.   As well as notes that were available from care everywhere and other healthcare systems.  Past medical history, social, surgical and family history all reviewed in electronic medical record.  No pertanent information unless stated regarding to the chief complaint.   Past Medical History:  Diagnosis Date   GERD (gastroesophageal reflux disease)    PUD (peptic ulcer disease)     Allergies[1]   Review of Systems:  No headache, visual changes, nausea, vomiting, diarrhea, constipation, dizziness, abdominal pain, skin rash, fevers, chills, night sweats, weight loss, swollen lymph nodes, body aches, joint swelling, chest pain, shortness of breath, mood changes. POSITIVE muscle aches  Objective  Blood pressure 92/60, pulse 69, height 5' 1 (1.549 m), weight 125 lb (56.7 kg), SpO2 98%.   General: No apparent distress alert and oriented x3 mood and affect normal, dressed appropriately.  HEENT: Pupils equal, extraocular movements intact  Respiratory: Patient's speak in full sentences and does not appear short of breath   Cardiovascular: No lower extremity edema, non tender, no erythema  Gait relatively normal MSK:  Back does have some loss of lordosis noted.  Some tenderness to palpation in the paraspinal musculature.  Tightness with FABER left greater than right.  Patient's left knee does have some tenderness noted over the medial aspect of the knee.  Tender to palpation and minorly over the medial joint line.  Negative McMurray's.  Lateral tracking of the patella noted with a positive grind test.  Limited muscular skeletal ultrasound was performed and interpreted by CLAUDENE HUSSAR, M  Limited ultrasound does show a contusion noted.  This shows some hyperechoic changes of the proximal tibia but no cortical irregularity to be concerning for a true stress fracture  Osteopathic findings C6 flexed rotated and side bent left T3 extended rotated and side bent right inhaled rib T9 extended rotated and side bent left L1 flexed rotated and side bent right Sacrum right on right     Assessment and Plan:  Low back pain Chronic multifactorial, likely went and had an exacerbation secondary to skiing where patient did fall.  I do not see any acute midline tenderness.  Patient is trying to be more active.  Increase activity slowly.    Nonallopathic problems  Decision today to treat with OMT was based on Physical Exam  After verbal consent patient was treated with HVLA, ME, FPR techniques in cervical, rib, thoracic, lumbar, and sacral  areas  Patient tolerated the procedure well with improvement in symptoms  Patient given exercises, stretches and lifestyle  modifications  See medications in patient instructions if given  Patient will follow up in 4-8 weeks     The above documentation has been reviewed and is accurate and complete [name] M Vasilis Luhman, DO         Note: This dictation was prepared with Dragon dictation along with smaller phrase technology. Any transcriptional errors that result from this  process are unintentional.            [1] No Known Allergies  "

## 2024-04-18 ENCOUNTER — Encounter: Payer: Self-pay | Admitting: Family Medicine

## 2024-04-18 ENCOUNTER — Ambulatory Visit: Admitting: Family Medicine

## 2024-04-18 ENCOUNTER — Other Ambulatory Visit: Payer: Self-pay

## 2024-04-18 VITALS — BP 92/60 | HR 69 | Ht 61.0 in | Wt 125.0 lb

## 2024-04-18 DIAGNOSIS — S8002XA Contusion of left knee, initial encounter: Secondary | ICD-10-CM | POA: Diagnosis not present

## 2024-04-18 DIAGNOSIS — M9903 Segmental and somatic dysfunction of lumbar region: Secondary | ICD-10-CM

## 2024-04-18 DIAGNOSIS — M545 Low back pain, unspecified: Secondary | ICD-10-CM

## 2024-04-18 DIAGNOSIS — M9901 Segmental and somatic dysfunction of cervical region: Secondary | ICD-10-CM

## 2024-04-18 DIAGNOSIS — M9908 Segmental and somatic dysfunction of rib cage: Secondary | ICD-10-CM

## 2024-04-18 DIAGNOSIS — M9904 Segmental and somatic dysfunction of sacral region: Secondary | ICD-10-CM

## 2024-04-18 DIAGNOSIS — M25562 Pain in left knee: Secondary | ICD-10-CM | POA: Diagnosis not present

## 2024-04-18 DIAGNOSIS — G8929 Other chronic pain: Secondary | ICD-10-CM | POA: Diagnosis not present

## 2024-04-18 DIAGNOSIS — M9902 Segmental and somatic dysfunction of thoracic region: Secondary | ICD-10-CM | POA: Diagnosis not present

## 2024-04-18 MED ORDER — VITAMIN D (ERGOCALCIFEROL) 1.25 MG (50000 UNIT) PO CAPS: 50000.0000 [IU] | ORAL_CAPSULE | ORAL | 0 refills | Status: AC

## 2024-04-18 NOTE — Assessment & Plan Note (Signed)
 Was able to run 5 miles but does have a contusion.  Encouraged her to take a step back.  Once weekly vitamin D  given.  Has had the history of the tibial plateau fracture.  Need to take it a little bit slowly to make sure no significant difficulty.

## 2024-04-18 NOTE — Patient Instructions (Addendum)
 Good to see you. Vitamin D  Arnica lotion twice daily. VMO exercises See me again in 2 months.

## 2024-04-18 NOTE — Assessment & Plan Note (Signed)
 Chronic multifactorial, likely went and had an exacerbation secondary to skiing where patient did fall.  I do not see any acute midline tenderness.  Patient is trying to be more active.  Increase activity slowly.

## 2024-06-18 ENCOUNTER — Ambulatory Visit: Admitting: Family Medicine
